# Patient Record
Sex: Male | Born: 1970 | Race: White | Hispanic: No | Smoking: Never smoker
Health system: Southern US, Community
[De-identification: ages and names within clinical notes are randomized; demographics above are authoritative.]

## PROBLEM LIST (undated history)

## (undated) DIAGNOSIS — C4490 Unspecified malignant neoplasm of skin, unspecified: Secondary | ICD-10-CM

## (undated) DIAGNOSIS — R002 Palpitations: Secondary | ICD-10-CM

## (undated) DIAGNOSIS — F419 Anxiety disorder, unspecified: Secondary | ICD-10-CM

## (undated) DIAGNOSIS — F329 Major depressive disorder, single episode, unspecified: Secondary | ICD-10-CM

## (undated) DIAGNOSIS — F32A Depression, unspecified: Secondary | ICD-10-CM

## (undated) DIAGNOSIS — J45909 Unspecified asthma, uncomplicated: Secondary | ICD-10-CM

## (undated) HISTORY — DX: Major depressive disorder, single episode, unspecified: F32.9

## (undated) HISTORY — DX: Depression, unspecified: F32.A

## (undated) HISTORY — DX: Unspecified asthma, uncomplicated: J45.909

## (undated) HISTORY — DX: Unspecified malignant neoplasm of skin, unspecified: C44.90

## (undated) HISTORY — DX: Anxiety disorder, unspecified: F41.9

## (undated) HISTORY — DX: Palpitations: R00.2

---

## 2009-03-02 ENCOUNTER — Ambulatory Visit: Payer: Self-pay | Admitting: Internal Medicine

## 2009-08-04 ENCOUNTER — Ambulatory Visit: Payer: Self-pay | Admitting: Internal Medicine

## 2009-11-22 ENCOUNTER — Ambulatory Visit: Payer: Self-pay | Admitting: Urology

## 2010-06-06 ENCOUNTER — Ambulatory Visit: Payer: Self-pay | Admitting: Urology

## 2012-08-05 ENCOUNTER — Ambulatory Visit (INDEPENDENT_AMBULATORY_CARE_PROVIDER_SITE_OTHER): Payer: BC Managed Care – PPO | Admitting: Cardiovascular Disease

## 2012-08-05 ENCOUNTER — Encounter: Payer: Self-pay | Admitting: Cardiovascular Disease

## 2012-08-05 VITALS — BP 120/70 | HR 72 | Ht 68.0 in | Wt 146.5 lb

## 2012-08-05 VITALS — BP 128/70 | HR 72 | Ht 68.0 in | Wt 146.5 lb

## 2012-08-05 DIAGNOSIS — M542 Cervicalgia: Secondary | ICD-10-CM

## 2012-08-05 DIAGNOSIS — R079 Chest pain, unspecified: Secondary | ICD-10-CM

## 2012-08-05 DIAGNOSIS — R002 Palpitations: Secondary | ICD-10-CM

## 2012-08-05 NOTE — Patient Instructions (Addendum)
Stress test was normal. Please discuss additional workup with your primary care physician if needed.

## 2012-08-05 NOTE — Patient Instructions (Addendum)
Patient to have a treadmill today. Follow up as needed.

## 2012-08-05 NOTE — Progress Notes (Signed)
   Patient ID: Jesse Norman, male    DOB: 1970/09/27, 41 y.o.   MRN: [id number]  HPI Comments: Mr. Jesse Norman is a very pleasant gentleman with history of palpitations, patient of Dr. Arlana Pouch with recent dog bite to the right part of his neck and symptoms of neck throbbing in chest discomfort since that time.   He reports that he was doing well until the dog bite. No skin lacerations or wounds noted and they are all healed at this time from the bite. He has reported a strong throbbing in his neck typically with exertion sometimes radiating into his upper chest. Walking seems to make this worse. Sometimes noted at rest as well. There seems to be a positional component. He denies any nose drainage ear drainage, no throat swelling. No cough or edema. He typically works out on a regular basis but has not done so recently.  EKG shows normal sinus rhythm with rate 72 beats per minute, no significant ST or T wave changes.    Outpatient Encounter Prescriptions as of 08/05/2012  Medication Sig Dispense Refill  . sertraline (ZOLOFT) 50 MG tablet Take 50 mg by mouth daily.       Review of Systems  Constitutional: Negative.   HENT: Positive for neck pain.   Eyes: Negative.   Respiratory: Negative.   Cardiovascular: Positive for chest pain and palpitations.  Gastrointestinal: Negative.   Skin: Negative.   Neurological: Negative.   Hematological: Negative.   Psychiatric/Behavioral: Negative.   All other systems reviewed and are negative.    BP 120/70  Pulse 72  Ht 5\' 8"  (1.727 m)  Wt 146 lb 8 oz (66.452 kg)  BMI 22.28 kg/m2  Physical Exam  Nursing note and vitals reviewed. Constitutional: He is oriented to person, place, and time. He appears well-developed and well-nourished.  HENT:  Head: Normocephalic.  Nose: Nose normal.  Mouth/Throat: Oropharynx is clear and moist.  Eyes: Conjunctivae normal are normal. Pupils are equal, round, and reactive to light.  Neck: Normal range of motion. Neck supple.  No JVD present.  Cardiovascular: Normal rate, regular rhythm, S1 normal, S2 normal, normal heart sounds and intact distal pulses.  Exam reveals no gallop and no friction rub.   No murmur heard. Pulmonary/Chest: Effort normal and breath sounds normal. No respiratory distress. He has no wheezes. He has no rales. He exhibits no tenderness.  Abdominal: Soft. Bowel sounds are normal. He exhibits no distension. There is no tenderness.  Musculoskeletal: Normal range of motion. He exhibits no edema and no tenderness.  Lymphadenopathy:    He has no cervical adenopathy.  Neurological: He is alert and oriented to person, place, and time. Coordination normal.  Skin: Skin is warm and dry. No rash noted. No erythema.  Psychiatric: He has a normal mood and affect. His behavior is normal. Judgment and thought content normal.           Assessment and Plan

## 2012-08-05 NOTE — Procedures (Signed)
Treadmill ordered for recent epsiodes of chest pain.  Resting EKG shows NSR with rate of 81 with no significant St or T wave changes Resting blood pressure of 122/72. Stand bruce protocal was used.  Patient exercised for 10 min 00 sec,  Peak heart rate of 163 bpm.  This was 92% of the maximum predicted heart rate (target heart rate 150). Achieved 12.9 METS No symptoms of chest pain or lightheadedness were reported at peak stress or in recovery.  Peak Blood pressure recorded was 170/82 Heart rate at 3 minutes in recovery was 91 bpm. No significant ST changes concerning for ischemia  FINAL IMPRESSION: Normal exercise stress test. No significant EKG changes concerning for ischemia. Excellent exercise tolerance.

## 2012-08-05 NOTE — Assessment & Plan Note (Signed)
Etiology of his neck and chest pain is uncertain. We have discussed the various treatment options including medical management versus treadmill testing. He would like a treadmill to exclude cardiac etiology. This will be scheduled today. If this is normal, no further cardiac workup would likely be indicated. Normal clinical exam.

## 2016-09-25 DIAGNOSIS — C4490 Unspecified malignant neoplasm of skin, unspecified: Secondary | ICD-10-CM

## 2016-09-25 HISTORY — PX: OTHER SURGICAL HISTORY: SHX169

## 2016-09-25 HISTORY — DX: Unspecified malignant neoplasm of skin, unspecified: C44.90

## 2017-12-14 ENCOUNTER — Other Ambulatory Visit: Payer: Self-pay

## 2017-12-14 ENCOUNTER — Ambulatory Visit
Admission: EM | Admit: 2017-12-14 | Discharge: 2017-12-14 | Disposition: A | Discharge status: Home / Self Care | Payer: BC Managed Care – PPO | Attending: Family Medicine | Admitting: Family Medicine

## 2017-12-14 ENCOUNTER — Encounter: Payer: Self-pay | Admitting: *Deleted

## 2017-12-14 ENCOUNTER — Ambulatory Visit (INDEPENDENT_AMBULATORY_CARE_PROVIDER_SITE_OTHER): Payer: BC Managed Care – PPO

## 2017-12-14 DIAGNOSIS — R1012 Left upper quadrant pain: Secondary | ICD-10-CM

## 2017-12-14 DIAGNOSIS — R1013 Epigastric pain: Secondary | ICD-10-CM | POA: Diagnosis not present

## 2017-12-14 LAB — URINALYSIS, COMPLETE (UACMP) WITH MICROSCOPIC
BILIRUBIN URINE: NEGATIVE
Bacteria, UA: NONE SEEN
Glucose, UA: NEGATIVE mg/dL
Ketones, ur: NEGATIVE mg/dL
Leukocytes, UA: NEGATIVE
NITRITE: NEGATIVE
Protein, ur: NEGATIVE mg/dL
SPECIFIC GRAVITY, URINE: 1.01 (ref 1.005–1.030)
Squamous Epithelial / LPF: NONE SEEN
WBC UA: NONE SEEN WBC/hpf (ref 0–5)
pH: 6 (ref 5.0–8.0)

## 2017-12-14 LAB — COMPREHENSIVE METABOLIC PANEL
ALT: 14 U/L — AB (ref 17–63)
AST: 24 U/L (ref 15–41)
Albumin: 4.2 g/dL (ref 3.5–5.0)
Alkaline Phosphatase: 47 U/L (ref 38–126)
Anion gap: 8 (ref 5–15)
BUN: 13 mg/dL (ref 6–20)
CO2: 27 mmol/L (ref 22–32)
CREATININE: 1.04 mg/dL (ref 0.61–1.24)
Calcium: 9.1 mg/dL (ref 8.9–10.3)
Chloride: 99 mmol/L — ABNORMAL LOW (ref 101–111)
GFR calc non Af Amer: >60 mL/min (ref >60)
Glucose, Bld: 128 mg/dL — ABNORMAL HIGH (ref 65–99)
Potassium: 3.9 mmol/L (ref 3.5–5.1)
SODIUM: 134 mmol/L — AB (ref 135–145)
Total Bilirubin: 0.8 mg/dL (ref 0.3–1.2)
Total Protein: 7.6 g/dL (ref 6.5–8.1)

## 2017-12-14 LAB — LIPASE, BLOOD: Lipase: 25 U/L (ref 11–51)

## 2017-12-14 NOTE — ED Provider Notes (Signed)
[facility]    CSN: [id number] Arrival date & time: 12/14/17  1513     History   Chief Complaint Chief Complaint  Patient presents with  . Abdominal Pain    HPI Jesse Norman is a 47 y.o. male.    Abdominal Pain  Pain location:  Epigastric and LUQ Pain quality: aching   Pain radiates to:  Does not radiate Pain severity:  Mild Onset quality:  Gradual Duration:  4 weeks (worse LUQ for 1 week) Timing:  Constant Progression:  Waxing and waning Chronicity:  New Context: not trauma   Relieved by:  Nothing Worsened by:  Nothing Associated symptoms: no chest pain, no chills, no constipation, no cough, no diarrhea, no dysuria, no fever, no hematochezia, no hematuria, no melena, no nausea, no shortness of breath and no sore throat     Past Medical History:  Diagnosis Date  . Anxiety and depression   . Asthma    allergies  . Palpitations     Patient Active Problem List   Diagnosis Date Noted  . Chest pain 08/05/2012    History reviewed. No pertinent surgical history.     Home Medications    Prior to Admission medications   Medication Sig Start Date End Date Taking? Authorizing Provider  sertraline (ZOLOFT) 50 MG tablet Take 50 mg by mouth.    [provider]    Family History Family History  Problem Relation Age of Onset  . Heart attack Maternal Grandfather     Social History Social History   Tobacco Use  . Smoking status: Never Smoker  . Smokeless tobacco: Never Used  Substance Use Topics  . Alcohol use: Yes    Comment: social drinker  . Drug use: No     Allergies   Patient has no known allergies.   Review of Systems Review of Systems  Constitutional: Negative for chills and fever.  HENT: Negative for drooling, ear discharge, ear pain and sore throat.   Respiratory: Negative for cough, shortness of breath and wheezing.   Cardiovascular: Negative for chest pain, palpitations and leg swelling.  Gastrointestinal:  Positive for abdominal pain. Negative for blood in stool, constipation, diarrhea, hematochezia, melena and nausea.  Endocrine: Negative for polydipsia.  Genitourinary: Negative for dysuria, frequency, hematuria and urgency.  Musculoskeletal: Negative for back pain, myalgias and neck pain.  Skin: Negative for rash.  Allergic/Immunologic: Negative for environmental allergies.  Neurological: Negative for dizziness and headaches.  Hematological: Does not bruise/bleed easily.  Psychiatric/Behavioral: Negative for suicidal ideas. The patient is not nervous/anxious.      Physical Exam Triage Vital Signs ED Triage Vitals  Enc Vitals Group     BP 12/14/17 1614 (!) 143/79     Pulse Rate 12/14/17 1614 85     Resp 12/14/17 1614 16     Temp 12/14/17 1614 98.7 F (37.1 C)     Temp Source 12/14/17 1614 Oral     SpO2 12/14/17 1614 100 %     Weight 12/14/17 1618 147 lb (66.7 kg)     Height 12/14/17 1618 5\' 8"  (1.727 m)     Head Circumference --      Peak Flow --      Pain Score 12/14/17 1615 2     Pain Loc --      Pain Edu? --      Excl. in [place]? --    No data found.  Updated Vital Signs BP (!) 143/79 (BP Location: Left Arm)  Pulse 85   Temp 98.7 F (37.1 C) (Oral)   Resp 16   Ht 5\' 8"  (1.727 m)   Wt 147 lb (66.7 kg)   SpO2 100%   BMI 22.35 kg/m   Visual Acuity Right Eye Distance:   Left Eye Distance:   Bilateral Distance:    Right Eye Near:   Left Eye Near:    Bilateral Near:     Physical Exam  Constitutional: He appears well-developed.  HENT:  Head: Normocephalic.  Mouth/Throat: Oropharynx is clear and moist.  Eyes: Pupils are equal, round, and reactive to light. EOM are normal.  Cardiovascular: Normal rate, regular rhythm and normal heart sounds. Exam reveals no gallop and no friction rub.  No murmur heard. Pulmonary/Chest: Effort normal and breath sounds normal. No respiratory distress. He has no wheezes.  Abdominal: Soft. Normal appearance, normal aorta and bowel  sounds are normal. He exhibits no distension, no ascites and no mass. There is hepatomegaly. There is no splenomegaly. There is tenderness in the epigastric area and left upper quadrant. There is no guarding and no CVA tenderness.  Skin: Skin is warm.     UC Treatments / Results  Labs (all labs ordered are listed, but only abnormal results are displayed) Labs Reviewed  URINALYSIS, COMPLETE (UACMP) WITH MICROSCOPIC - Abnormal; Notable for the following components:      Result Value   Hgb urine dipstick TRACE (*)    All other components within normal limits  COMPREHENSIVE METABOLIC PANEL - Abnormal; Notable for the following components:   Sodium 134 (*)    Chloride 99 (*)    Glucose, Bld 128 (*)    ALT 14 (*)    All other components within normal limits  LIPASE, BLOOD    EKG None Radiology Dg Abd 1 View  Result Date: 12/14/2017 CLINICAL DATA:  Flank pain on the left.  History of kidney stones. EXAM: ABDOMEN - 1 VIEW COMPARISON:  None. FINDINGS: The bowel gas pattern is normal. No radio-opaque calculi or other significant radiographic abnormality are seen. IMPRESSION: No radiopaque calculi overlying the renal shadows or ureters. Electronically Signed   By: Ulyses Jarred M.D.   On: 12/14/2017 17:22    Procedures Procedures (including critical care time)  Medications Ordered in UC Medications - No data to display   Initial Impression / Assessment and Plan / UC Course  I have reviewed the triage vital signs and the nursing notes.  Pertinent labs & imaging results that were available during my care of the patient were reviewed by me and considered in my medical decision making (see chart for details).       Final Clinical Impressions(s) / UC Diagnoses   Final diagnoses:  Left upper quadrant pain    ED Discharge Orders    None       Controlled Substance Prescriptions [place] Controlled Substance Registry consulted?    Juline Patch, MD 12/14/17 [phone]

## 2017-12-14 NOTE — ED Triage Notes (Addendum)
Patient started having periodic abdominal pain 1 month ago. Last PM the LUQ pain became worse radiating to the left flank. Previous history of left side kidney stones 8 years ago.

## 2018-01-21 ENCOUNTER — Other Ambulatory Visit: Payer: Self-pay

## 2018-01-21 ENCOUNTER — Ambulatory Visit
Admission: EM | Admit: 2018-01-21 | Discharge: 2018-01-21 | Discharge status: Against Medical Advice | Payer: BC Managed Care – PPO

## 2018-01-21 ENCOUNTER — Ambulatory Visit
Admission: EM | Admit: 2018-01-21 | Discharge: 2018-01-21 | Disposition: A | Discharge status: Home / Self Care | Payer: BC Managed Care – PPO | Attending: Emergency Medicine | Admitting: Emergency Medicine

## 2018-01-21 DIAGNOSIS — W268XXA Contact with other sharp object(s), not elsewhere classified, initial encounter: Secondary | ICD-10-CM | POA: Diagnosis not present

## 2018-01-21 DIAGNOSIS — Z23 Encounter for immunization: Secondary | ICD-10-CM

## 2018-01-21 DIAGNOSIS — S61231A Puncture wound without foreign body of left index finger without damage to nail, initial encounter: Secondary | ICD-10-CM

## 2018-01-21 DIAGNOSIS — S61238A Puncture wound without foreign body of other finger without damage to nail, initial encounter: Secondary | ICD-10-CM

## 2018-01-21 MED ORDER — TETANUS-DIPHTH-ACELL PERTUSSIS 5-2.5-18.5 LF-MCG/0.5 IM SUSP
0.5000 mL | Freq: Once | INTRAMUSCULAR | Status: AC
  Administered 2018-01-21: 0.5 mL via INTRAMUSCULAR

## 2018-01-21 NOTE — Discharge Instructions (Signed)
Your tetanus has been updated. Watch for signs of infection such as swelling, redness, draining pus, if any of the symptoms, return to clinic for reevaluation. Otherwise follow-up with primary care as needed.

## 2018-01-21 NOTE — ED Triage Notes (Signed)
Pt was tuning his guitar when one of the strings caused a puncture wound to left hand index finger. Reports he is here for tetanus shot.

## 2018-01-21 NOTE — ED Provider Notes (Signed)
[facility]    CSN: [id number] Arrival date & time: 01/21/18  1009     History   Chief Complaint Chief Complaint  Patient presents with  . Puncture Wound    HPI Jesse Norman is a 47 y.o. male.   47 year old male presents to clinic for updating tetanus vaccine. States he received a puncture wound while changing out his guitar strains 48 hours ago. Does not recall his last tetanus vaccine, however greater than 10 years ago. Denies any pain, redness, swelling, pain with movement, discharge from the finger. He is right handed. Injured finger is th left index finger.no systemic symptoms such as weakness, malaise, fever, nausea, etc.  The history is provided by the patient.    Past Medical History:  Diagnosis Date  . Anxiety and depression   . Asthma    allergies  . Palpitations     Patient Active Problem List   Diagnosis Date Noted  . Chest pain 08/05/2012    History reviewed. No pertinent surgical history.     Home Medications    Prior to Admission medications   Not on File    Family History Family History  Problem Relation Age of Onset  . Heart attack Maternal Grandfather     Social History Social History   Tobacco Use  . Smoking status: Never Smoker  . Smokeless tobacco: Never Used  Substance Use Topics  . Alcohol use: Yes    Comment: social drinker  . Drug use: No     Allergies   Patient has no known allergies.   Review of Systems Review of Systems  Constitutional: Negative for activity change, appetite change, chills, fatigue and fever.  Gastrointestinal: Negative for nausea and vomiting.  Musculoskeletal: Negative for arthralgias and joint swelling.  Skin: Positive for wound. Negative for color change.  Neurological: Negative for dizziness and light-headedness.     Physical Exam Triage Vital Signs ED Triage Vitals  Enc Vitals Group     BP 01/21/18 1016 138/89     Pulse Rate 01/21/18 1016 84     Resp 01/21/18 1016 16       Temp 01/21/18 1016 97.9 F (36.6 C)     Temp Source 01/21/18 1016 Oral     SpO2 01/21/18 1016 100 %     Weight 01/21/18 1018 148 lb (67.1 kg)     Height 01/21/18 1018 5\' 8"  (1.727 m)     Head Circumference --      Peak Flow --      Pain Score 01/21/18 1018 1     Pain Loc --      Pain Edu? --      Excl. in [place]? --    No data found.  Updated Vital Signs BP 138/89 (BP Location: Right Arm)   Pulse 84   Temp 97.9 F (36.6 C) (Oral)   Resp 16   Ht 5\' 8"  (1.727 m)   Wt 148 lb (67.1 kg)   SpO2 100%   BMI 22.50 kg/m   Visual Acuity Right Eye Distance:   Left Eye Distance:   Bilateral Distance:    Right Eye Near:   Left Eye Near:    Bilateral Near:     Physical Exam  Constitutional: He appears well-developed and well-nourished. No distress.  HENT:  Head: Normocephalic and atraumatic.  Musculoskeletal: He exhibits no tenderness.  Neurological: He is alert.  Skin: Skin is warm and dry. Capillary refill takes less than 2 seconds. He is  not diaphoretic.  Small puncture wound for surface left index finger, no erythema, swelling, purlent discharge, capillary refill less than 2 seconds, no pain with flexion, extension, or other movement, sensation intact distally.  Nursing note and vitals reviewed.    UC Treatments / Results  Labs (all labs ordered are listed, but only abnormal results are displayed) Labs Reviewed - No data to display  EKG None Radiology No results found.  Procedures Procedures (including critical care time)  Medications Ordered in UC Medications  Tdap (BOOSTRIX) injection 0.5 mL (0.5 mLs Intramuscular Given 01/21/18 1100)     Initial Impression / Assessment and Plan / UC Course  I have reviewed the triage vital signs and the nursing notes.  Pertinent labs & imaging results that were available during my care of the patient were reviewed by me and considered in my medical decision making (see chart for details).     No evidence of infection,  impairment of sensory function, or movement, provided follow-up counseling should symptoms worsen, an updated tetanus. Return to clinic if any signs of infection.  Final Clinical Impressions(s) / UC Diagnoses   Final diagnoses:  Puncture wound of index finger, initial encounter    ED Discharge Orders    None       Controlled Substance Prescriptions [place] Controlled Substance Registry consulted? Not Applicable   Barnet Glasgow, NP 01/21/18 1121

## 2018-06-03 ENCOUNTER — Ambulatory Visit
Admission: EM | Admit: 2018-06-03 | Discharge: 2018-06-03 | Disposition: A | Discharge status: Home / Self Care | Payer: BC Managed Care – PPO | Attending: Registered Nurse | Admitting: Registered Nurse

## 2018-06-03 ENCOUNTER — Encounter: Payer: Self-pay | Admitting: Emergency Medicine

## 2018-06-03 ENCOUNTER — Other Ambulatory Visit: Payer: Self-pay

## 2018-06-03 DIAGNOSIS — R05 Cough: Secondary | ICD-10-CM | POA: Diagnosis not present

## 2018-06-03 DIAGNOSIS — B9789 Other viral agents as the cause of diseases classified elsewhere: Secondary | ICD-10-CM

## 2018-06-03 DIAGNOSIS — J019 Acute sinusitis, unspecified: Secondary | ICD-10-CM | POA: Diagnosis not present

## 2018-06-03 DIAGNOSIS — J069 Acute upper respiratory infection, unspecified: Secondary | ICD-10-CM

## 2018-06-03 MED ORDER — FLUTICASONE PROPIONATE 50 MCG/ACT NA SUSP: 1.0000 | Freq: Two times a day (BID) | NASAL | 0 refills | Status: DC

## 2018-06-03 MED ORDER — SALINE SPRAY 0.65 % NA SOLN: 2.0000 | NASAL | 0 refills | Status: DC

## 2018-06-03 MED ORDER — AMOXICILLIN-POT CLAVULANATE 875-125 MG PO TABS: 1.0000 | ORAL_TABLET | Freq: Two times a day (BID) | ORAL | 0 refills | Status: DC

## 2018-06-03 MED ORDER — LORATADINE 10 MG PO TABS: 10.0000 mg | ORAL_TABLET | Freq: Every day | ORAL | 0 refills | Status: DC

## 2018-06-03 NOTE — ED Provider Notes (Signed)
[facility]    CSN: [id number] Arrival date & time: 06/03/18  1105     History   Chief Complaint Chief Complaint  Patient presents with  . Nasal Congestion    HPI Jesse Norman is a 47 y.o. male.   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  Stated seasonal allergies typically don't bother him.  Some sick contacts at work.  Has tried homeopathic without relief doesn't typically like to take medications every day.     Past Medical History:  Diagnosis Date  . Anxiety and depression   . Asthma    allergies  . Palpitations     Patient Active Problem List   Diagnosis Date Noted  . Chest pain 08/05/2012    History reviewed. No pertinent surgical history.     Home Medications    Prior to Admission medications   Medication Sig Start Date End Date Taking? Authorizing Provider  amoxicillin-clavulanate (AUGMENTIN) 875-125 MG tablet Take 1 tablet by mouth every 12 (twelve) hours. 06/03/18   Takelia Urieta, Aura Fey, NP  fluticasone (FLONASE) 50 MCG/ACT nasal spray Place 1 spray into both nostrils 2 (two) times daily. 06/03/18 07/03/18  Tahnee Cifuentes, Aura Fey, NP  loratadine (CLARITIN) 10 MG tablet Take 1 tablet (10 mg total) by mouth daily. 06/03/18   Mckensie Scotti, Aura Fey, NP  sodium chloride (OCEAN) 0.65 % SOLN nasal spray Place 2 sprays into both nostrils every 2 (two) hours while awake. 06/03/18 07/03/18  Brezlyn Manrique, Aura Fey, NP    Family History Family History  Problem Relation Age of Onset  . Healthy Mother   . Melanoma Father   . Hypertension Sister   . Healthy Brother   . Heart attack Maternal Grandfather     Social History Social History   Tobacco Use  . Smoking status: Never Smoker  . Smokeless tobacco: Never Used  Substance Use Topics  . Alcohol use: Yes    Comment: social drinker  . Drug use: No     Allergies   Patient has no known  allergies.   Review of Systems Review of Systems  Constitutional: Positive for activity change and fatigue. Negative for appetite change, chills, diaphoresis, fever and unexpected weight change.  HENT: Positive for congestion, postnasal drip, rhinorrhea and sore throat. Negative for dental problem, drooling, ear discharge, ear pain, facial swelling, hearing loss, mouth sores, nosebleeds, sinus pressure, sinus pain, sneezing, tinnitus, trouble swallowing and voice change.   Eyes: Negative for photophobia, pain, discharge, redness, itching and visual disturbance.  Respiratory: Positive for cough. Negative for choking, chest tightness, shortness of breath, wheezing and stridor.   Cardiovascular: Negative for chest pain, palpitations and leg swelling.  Gastrointestinal: Negative for abdominal distention, abdominal pain, blood in stool, constipation, diarrhea, nausea and vomiting.  Endocrine: Negative for cold intolerance and heat intolerance.  Genitourinary: Negative for dysuria.  Musculoskeletal: Negative for arthralgias, back pain, gait problem, joint swelling, myalgias, neck pain and neck stiffness.  Skin: Negative for color change, pallor, rash and wound.  Allergic/Immunologic: Positive for environmental allergies. Negative for food allergies and immunocompromised state.  Neurological: Negative for dizziness, tremors, seizures, syncope, facial asymmetry, speech difficulty, weakness, light-headedness, numbness and headaches.  Hematological: Negative for adenopathy. Does not bruise/bleed easily.  Psychiatric/Behavioral: Negative for agitation, behavioral problems, confusion and sleep disturbance.     Physical Exam Triage Vital Signs ED Triage Vitals  Enc Vitals Group     BP 06/03/18  1116 (!) 146/91     Pulse Rate 06/03/18 1116 77     Resp 06/03/18 1116 16     Temp 06/03/18 1116 97.9 F (36.6 C)     Temp Source 06/03/18 1116 Oral     SpO2 06/03/18 1116 100 %     Weight 06/03/18 1117 150  lb (68 kg)     Height 06/03/18 1117 5\' 8"  (1.727 m)     Head Circumference --      Peak Flow --      Pain Score 06/03/18 1117 0     Pain Loc --      Pain Edu? --      Excl. in [place]? --    No data found.  Updated Vital Signs BP (!) 146/91 (BP Location: Right Arm)   Pulse 77   Temp 97.9 F (36.6 C) (Oral)   Resp 16   Ht 5\' 8"  (1.727 m)   Wt 150 lb (68 kg)   SpO2 100%   BMI 22.81 kg/m   Visual Acuity Right Eye Distance:   Left Eye Distance:   Bilateral Distance:    Right Eye Near:   Left Eye Near:    Bilateral Near:     Physical Exam  Constitutional: He is oriented to person, place, and time. Vital signs are normal. He appears well-developed and well-nourished. He is active and cooperative.  Non-toxic appearance. He does not have a sickly appearance. He appears ill. No distress.  HENT:  Head: Normocephalic and atraumatic.  Right Ear: Hearing, external ear and ear canal normal. A middle ear effusion is present.  Left Ear: Hearing, external ear and ear canal normal. A middle ear effusion is present.  Nose: Mucosal edema and rhinorrhea present. No nose lacerations, sinus tenderness, nasal deformity, septal deviation or nasal septal hematoma. No epistaxis.  No foreign bodies. Right sinus exhibits no maxillary sinus tenderness and no frontal sinus tenderness. Left sinus exhibits maxillary sinus tenderness. Left sinus exhibits no frontal sinus tenderness.  Mouth/Throat: Uvula is midline and mucous membranes are normal. Mucous membranes are not pale, not dry and not cyanotic. He does not have dentures. No oral lesions. No trismus in the jaw. Normal dentition. No dental abscesses, uvula swelling, lacerations or dental caries. Posterior oropharyngeal edema and posterior oropharyngeal erythema present. No oropharyngeal exudate or tonsillar abscesses. No tonsillar exudate.  Left maxillary sinus TTP and pressure; cobblestoning posterior pharynx; bilateral TMs air fluid level clear; bilateral  allergic shiners; nasal turbinates edema/erythema clear discharge  Eyes: Pupils are equal, round, and reactive to light. Conjunctivae, EOM and lids are normal. Right eye exhibits no chemosis, no discharge, no exudate and no hordeolum. No foreign body present in the right eye. Left eye exhibits no chemosis, no discharge, no exudate and no hordeolum. No foreign body present in the left eye. Right conjunctiva is not injected. Right conjunctiva has no hemorrhage. Left conjunctiva is not injected. Left conjunctiva has no hemorrhage. No scleral icterus. Right eye exhibits normal extraocular motion and no nystagmus. Left eye exhibits normal extraocular motion and no nystagmus. Right pupil is round and reactive. Left pupil is round and reactive. Pupils are equal.  Neck: Trachea normal, normal range of motion and phonation normal. Neck supple. No tracheal tenderness, no spinous process tenderness and no muscular tenderness present. No neck rigidity. No tracheal deviation, no edema, no erythema and normal range of motion present. No thyroid mass and no thyromegaly present.  Cardiovascular: Normal rate, regular rhythm, S1 normal, S2 normal,  normal heart sounds and intact distal pulses. PMI is not displaced. Exam reveals no gallop and no friction rub.  No murmur heard. Pulmonary/Chest: Effort normal and breath sounds normal. No stridor. No respiratory distress. He has no decreased breath sounds. He has no wheezes. He has no rhonchi. He has no rales.  No cough observed in exam room; spoke full sentences without difficulty  Abdominal: Soft. Normal appearance. He exhibits no distension, no fluid wave and no ascites. There is no rigidity and no guarding.  Musculoskeletal: Normal range of motion. He exhibits no edema or tenderness.       Right shoulder: Normal.       Left shoulder: Normal.       Right elbow: Normal.      Left elbow: Normal.       Right hip: Normal.       Left hip: Normal.       Right knee: Normal.        Left knee: Normal.       Cervical back: Normal.       Thoracic back: Normal.       Lumbar back: Normal.       Right hand: Normal.       Left hand: Normal.  Lymphadenopathy:       Head (right side): No submental, no submandibular, no tonsillar, no preauricular, no posterior auricular and no occipital adenopathy present.       Head (left side): No submental, no submandibular, no tonsillar, no preauricular, no posterior auricular and no occipital adenopathy present.    He has no cervical adenopathy.       Right cervical: No superficial cervical, no deep cervical and no posterior cervical adenopathy present.      Left cervical: No superficial cervical, no deep cervical and no posterior cervical adenopathy present.  Neurological: He is alert and oriented to person, place, and time. He has normal strength. He is not disoriented. He displays no atrophy and no tremor. No cranial nerve deficit or sensory deficit. He exhibits normal muscle tone. He displays no seizure activity. Coordination and gait normal. GCS eye subscore is 4. GCS verbal subscore is 5. GCS motor subscore is 6.  On/off exam table and in/out of chair without difficulty; gait sure and steady in hallway  Skin: Skin is warm, dry and intact. Capillary refill takes less than 2 seconds. No abrasion, no bruising, no burn, no ecchymosis, no laceration, no lesion, no petechiae and no rash noted. He is not diaphoretic. No cyanosis or erythema. No pallor. Nails show no clubbing.  Psychiatric: He has a normal mood and affect. His speech is normal and behavior is normal. Judgment and thought content normal. He is not actively hallucinating. Cognition and memory are normal. He is attentive.  Nursing note and vitals reviewed.    UC Treatments / Results  Labs (all labs ordered are listed, but only abnormal results are displayed) Labs Reviewed - No data to display  EKG None  Radiology No results found.  Procedures Procedures (including  critical care time)  Medications Ordered in UC Medications - No data to display  Initial Impression / Assessment and Plan / UC Course  I have reviewed the triage vital signs and the nursing notes.  Pertinent labs & imaging results that were available during my care of the patient were reviewed by me and considered in my medical decision making (see chart for details).   viral URI with cough and acute rhinosinusitis  Discussed viral  uri in community typically 5-10 days runny nose/cough/fatigue most common some patients experiencing stomach upset nausea/vomiting/diarrhea.  Patient may use normal saline nasal spray 2 sprays each nostril q2h wa as needed. Start flonase 31mcg 1 spray each nostril BID #1 RF0.  Patient denied personal or family history of ENT cancer.  OTC antihistamine of choice claritin/zyrtec 10mg  po daily.  Avoid triggers if possible.  Shower prior to bedtime if exposed to triggers.  If allergic dust/dust mites recommend mattress/pillow covers/encasements; washing linens, vacuuming, sweeping, dusting weekly.  Call or return to clinic as needed if these symptoms worsen or fail to improve as anticipated.   Exitcare handout on allergic rhinitis, viral URI, sinusitis and sinus rinse given to patient.  Patient verbalized understanding of instructions, agreed with plan of care and had no further questions at this time.  P2:  Avoidance and hand washing.  Tylenol 1000mg  po QID prn pain.  Decrease dairy intake as increases mucous production.  Hydrate to keep urine pale clear and yellow tinged.  Rest.  Work excuse for the rest of todays shift given to patient.  start flonase 1 spray each nostril BID, saline 2 sprays each nostril q2h wa prn congestion.  If no improvement with 72 hours of saline and flonase use start augmentin 875mg  po BID x 10 days #20 RF0.  Electronic Rx given.  Denied personal or family history of ENT cancer.  Shower BID especially prior to bed. No evidence of systemic bacterial  infection, non toxic and well hydrated.  I do not see where any further testing or imaging is necessary at this time.   I will suggest supportive care, rest, good hygiene and encourage the patient to take adequate fluids.  The patient is to return to clinic or EMERGENCY ROOM if symptoms worsen or change significantly.  Exitcare handout on sinusitis and sinus rinse given to patient.  Patient verbalized agreement and understanding of treatment plan and had no further questions at this time.   P2:  Hand washing and cover cough Final Clinical Impressions(s) / UC Diagnoses   Final diagnoses:  Acute rhinosinusitis  Viral URI with cough     Discharge Instructions     Neti pot twice a day am/pm Flonase 1 spray each nostril am/pm after neti pot Take claritin 10mg  by mouth once a day If still teeth pain/sinus pressure after 3 days of nose sprays and claritin start augmentin 875mg  by mouth twice a day for 10 days    ED Prescriptions    Medication Sig Dispense Auth. Provider   loratadine (CLARITIN) 10 MG tablet Take 1 tablet (10 mg total) by mouth daily. 30 tablet Eniya Cannady A, NP   sodium chloride (OCEAN) 0.65 % SOLN nasal spray Place 2 sprays into both nostrils every 2 (two) hours while awake.  Jovane Foutz A, NP   fluticasone (FLONASE) 50 MCG/ACT nasal spray Place 1 spray into both nostrils 2 (two) times daily. 16 g Dub Maclellan A, NP   amoxicillin-clavulanate (AUGMENTIN) 875-125 MG tablet Take 1 tablet by mouth every 12 (twelve) hours. 20 tablet Adelene Polivka, Aura Fey, NP     Controlled Substance Prescriptions [place] Controlled Substance Registry consulted? Not Applicable   Olen Cordial, NP 06/03/18 2326

## 2018-06-03 NOTE — Discharge Instructions (Addendum)
Neti pot twice a day am/pm Flonase 1 spray each nostril am/pm after neti pot Take claritin 10mg  by mouth once a day If still teeth pain/sinus pressure after 3 days of nose sprays and claritin start augmentin 875mg  by mouth twice a day for 10 days

## 2018-06-03 NOTE — ED Triage Notes (Signed)
Patient in today c/o nasal congestion, sinus pressure/pain x 1 week.

## 2018-07-15 ENCOUNTER — Encounter: Payer: Self-pay | Admitting: Emergency Medicine

## 2018-07-15 ENCOUNTER — Other Ambulatory Visit: Payer: Self-pay

## 2018-07-15 ENCOUNTER — Ambulatory Visit
Admission: EM | Admit: 2018-07-15 | Discharge: 2018-07-15 | Disposition: A | Discharge status: Home / Self Care | Payer: BC Managed Care – PPO | Attending: Internal Medicine | Admitting: Internal Medicine

## 2018-07-15 DIAGNOSIS — R079 Chest pain, unspecified: Secondary | ICD-10-CM | POA: Diagnosis present

## 2018-07-15 DIAGNOSIS — M940 Chondrocostal junction syndrome [Tietze]: Secondary | ICD-10-CM | POA: Insufficient documentation

## 2018-07-15 NOTE — ED Triage Notes (Signed)
Patient c/o chest pain, 1st episode was on Friday. He had another episode today. Denies shortness of breath, diaphoresis. Patient states he did have some jaw pain yesterday and today.

## 2018-07-15 NOTE — Discharge Instructions (Signed)
Take Ibuprofen 400 -800 mg every 8 hours for 5-7 days  Follow up with your family Dr this week. If you get worse, or have change of symptoms like chest pressure, sweating, nausea, of chest pressure when you have palpitations call an ambulance to come get you to take you to the Er.

## 2018-07-15 NOTE — ED Provider Notes (Addendum)
[facility]    CSN: [id number] Arrival date & time: 07/15/18  1212     History   Chief Complaint Chief Complaint  Patient presents with  . Chest Pain    HPI Jesse Norman is a 47 y.o. male.   Onset of chest pains on R mid chest 3 days ago and would last a few seconds and described as a little stab, and would have a couple in 10 min, and would go away for a few hours. Then none  Til today, and had same pain on L chest this time, and has not returned yet. In the past  2 weeks has felt bilateral neck discomfort " sore' and palpation does not make it worse with some intermittent L jaw similar symptoms which would last a few seconds and after 10 seconds will feel it, and would feel some cramping on back as well. Neg CAD in family under 66 y. His last physical > 1 y. In the past his cholesterol levels were normal 4 years ago. Admits increased stress and been off Zoloft x 6-7 months.      Past Medical History:  Diagnosis Date  . Anxiety and depression   . Asthma    allergies  . Palpitations     Patient Active Problem List   Diagnosis Date Noted  . Chest pain 08/05/2012    History reviewed. No pertinent surgical history.     Home Medications    Prior to Admission medications   Not on File    Family History Family History  Problem Relation Age of Onset  . Healthy Mother   . Melanoma Father   . Hypertension Sister   . Healthy Brother   . Heart attack Maternal Grandfather     Social History Social History   Tobacco Use  . Smoking status: Never Smoker  . Smokeless tobacco: Never Used  Substance Use Topics  . Alcohol use: Yes    Comment: social drinker  . Drug use: No     Allergies   Patient has no known allergies.   Review of Systems Review of Systems  Constitutional: Negative for chills and fatigue.       Has been working on loosing wt and is a runner.   HENT: Negative for tinnitus.   Respiratory: Negative for chest tightness and  shortness of breath.   Cardiovascular: Positive for chest pain and palpitations. Negative for leg swelling.       Has been having palpitations for 20 yrs and had nuclear stress 2014 and holter which were normal. The holter picked up those palpitations but was told it was nothing.   Gastrointestinal: Negative for abdominal pain and diarrhea.       Has been burping more. Sometimes gets intermittent abd pain off and on  Genitourinary: Positive for flank pain.       Had R flank pain last week which lasted for few hours, but did not noticed any urine color changes.   Skin: Negative for rash.  Neurological: Positive for light-headedness. Negative for dizziness, speech difficulty, weakness and headaches.       Occasion light headness when he stands up, not during chest pain     Physical Exam Triage Vital Signs ED Triage Vitals  Enc Vitals Group     BP 07/15/18 1228 (!) 150/94     Pulse Rate 07/15/18 1228 70     Resp 07/15/18 1228 18     Temp 07/15/18 1228 97.9 F (36.6 C)  Temp Source 07/15/18 1228 Oral     SpO2 07/15/18 1228 100 %     Weight 07/15/18 1227 145 lb (65.8 kg)     Height 07/15/18 1227 5\' 8"  (1.727 m)     Head Circumference --      Peak Flow --      Pain Score 07/15/18 1227 0     Pain Loc --      Pain Edu? --      Excl. in [place]? --    No data found.  Updated Vital Signs BP (!) 150/94 (BP Location: Right Arm)   Pulse 70   Temp 97.9 F (36.6 C) (Oral)   Resp 18   Ht 5\' 8"  (1.727 m)   Wt 145 lb (65.8 kg)   SpO2 100%   BMI 22.05 kg/m   Visual Acuity Right Eye Distance:   Left Eye Distance:   Bilateral Distance:    Right Eye Near:   Left Eye Near:    Bilateral Near:     Physical Exam  Constitutional: He is oriented to person, place, and time.  HENT:  Head: Normocephalic and atraumatic.  Eyes: Pupils are equal, round, and reactive to light.  Neck: Neck supple. No thyromegaly present.  Cardiovascular: Normal rate and regular rhythm.  No murmur heard.   No systolic murmur is present. Pulmonary/Chest: Effort normal and breath sounds normal.  Has chest wall tenderness on mid ribs similar to the pain he has been having but not as intense  Abdominal: Bowel sounds are normal. He exhibits no mass. There is no splenomegaly or hepatomegaly. There is no tenderness.  Lymphadenopathy:    He has no cervical adenopathy.  Neurological: He is alert and oriented to person, place, and time.  Skin: Capillary refill takes less than 2 seconds.  Psychiatric:  Has poor eye contact     UC Treatments / Results  Labs (all labs ordered are listed, but only abnormal results are displayed) Labs Reviewed - No data to display  EKG Normal  Radiology No results found.  Procedures   Medications Ordered in UC Medications - No data to display  Initial Impression / Assessment and Plan / UC Course  I have reviewed the triage vital signs and the nursing notes.  Pertinent labs & imaging results that were available during my care of the patient were reviewed by me and considered in my medical decision making (see chart for details).     Final Clinical Impressions(s) / UC Diagnoses   Final diagnoses:  Costochondritis     Discharge Instructions     Take Ibuprofen 400 -800 mg every 8 hours for 5-7 days  Follow up with your family Dr this week. If you get worse, or have change of symptoms like chest pressure, sweating, nausea, of chest pressure when you have palpitations call an ambulance to come get you to take you to the Er.     ED Prescriptions    None     Controlled Substance Prescriptions [place] Controlled Substance Registry consulted?    Shelby Mattocks, PA-C 07/18/18 1601    Rodriguez-[place], Sunday Spillers, PA-C 07/18/18 1606

## 2018-09-19 ENCOUNTER — Encounter: Payer: Self-pay | Admitting: Urology

## 2018-09-19 ENCOUNTER — Ambulatory Visit: Payer: BC Managed Care – PPO | Admitting: Urology

## 2018-09-19 VITALS — BP 147/85 | HR 89 | Ht 68.0 in | Wt 136.4 lb

## 2018-09-19 DIAGNOSIS — R309 Painful micturition, unspecified: Secondary | ICD-10-CM

## 2018-09-19 DIAGNOSIS — R102 Pelvic and perineal pain: Secondary | ICD-10-CM

## 2018-09-19 LAB — MICROSCOPIC EXAMINATION
BACTERIA UA: NONE SEEN
EPITHELIAL CELLS (NON RENAL): NONE SEEN /HPF (ref 0–10)
WBC, UA: NONE SEEN /hpf (ref 0–5)

## 2018-09-19 LAB — URINALYSIS, COMPLETE
Bilirubin, UA: NEGATIVE
Glucose, UA: NEGATIVE
KETONES UA: NEGATIVE
Leukocytes, UA: NEGATIVE
NITRITE UA: NEGATIVE
PH UA: 7 (ref 5.0–7.5)
Protein, UA: NEGATIVE
SPEC GRAV UA: 1.015 (ref 1.005–1.030)
Urobilinogen, Ur: 0.2 mg/dL (ref 0.2–1.0)

## 2018-09-19 MED ORDER — DIAZEPAM 5 MG PO TABS: 5.0000 mg | ORAL_TABLET | Freq: Once | ORAL | 0 refills | Status: DC | PRN

## 2018-09-19 NOTE — Progress Notes (Signed)
09/19/2018 6:04 PM   Jesse Norman 1971/03/23 [id number]  Referring provider: Albina Billet, MD [address]   [place], [place] [address]  CC: Dysuria  HPI: I saw Jesse Norman in urology clinic in consultation from Dr. Hall Busing for dysuria.  He is a 47 year old male with severe anxiety who reports 5 to 6 weeks of varied urinary symptoms.  He reports a "pulsing" sensation in his bladder, dysuria at the end of his urinary stream, and significant urinary frequency 10-12 times per day.  He has mild nocturia 1-2 times per night.  He also reports vague pelvic pain and discomfort, especially in the perineal suprapubic area.  He has no prior notable urologic history.  He denies any flank pain or hematuria.  Urinalysis and culture have all been negative, as well as STD testing.  He was treated by his primary with a extended course of Cipro, with no improvement in his symptoms.  He was also seen by Dr. Yves Dill.  There are no aggravating or alleviating factors.  The symptoms are very bothersome to him and worsening his anxiety.  Severity is moderate.  He denies a history of kidney stones.  He had a KUB in March 2019 that showed no radiopaque calculi.   PMH: Past Medical History:  Diagnosis Date  . Anxiety and depression   . Asthma    allergies  . Palpitations     Surgical History: No past surgical history on file.   Allergies: No Known Allergies  Family History: Family History  Problem Relation Age of Onset  . Healthy Mother   . Melanoma Father   . Hypertension Sister   . Healthy Brother   . Heart attack Maternal Grandfather     Social History:  reports that he has never smoked. He has never used smokeless tobacco. He reports current alcohol use. He reports that he does not use drugs.  ROS: Please see flowsheet from today's date for complete review of systems.  Physical Exam: BP (!) 147/85 (BP Location: Left Arm, Patient Position: Sitting, Cuff Size: Normal)   Pulse 89   Ht 5\' 8"   (1.727 m)   Wt 136 lb 6.4 oz (61.9 kg)   BMI 20.74 kg/m    Constitutional:  Alert and oriented, No acute distress. Cardiovascular: No clubbing, cyanosis, or edema. Respiratory: Normal respiratory effort, no increased work of breathing. GI: Abdomen is soft, nontender, nondistended, no abdominal masses GU: No CVA tenderness, phallus without lesions, widely patent meatus DRE: 40 g, prominent sulcus, no nodules or masses Lymph: No cervical or inguinal lymphadenopathy. Skin: No rashes, bruises or suspicious lesions. Neurologic: Grossly intact, no focal deficits, moving all 4 extremities. Psychiatric: Anxious  Laboratory Data: Reviewed, urinalyses all negative, STD testing negative  Urinalysis today 0 WBCs, 0-2 RBCs, no epithelial cells, no bacteria, nitrite negative  Assessment & Plan:   In summary, Jesse Norman is a healthy 47 year old male with severe anxiety with 6 weeks of of vague pelvic pain and dysuria with urinary frequency.  Infectious work-up has all been negative.  He does not have hematuria or flank pain concerning for nephrolithiasis.  His symptoms could be consistent with urethral stricture disease versus pelvic pain related to his anxiety.  -Cystoscopy next available to rule out urethral stricture/pathology.  If negative, recommend 4 weeks scheduled anti-inflammatories and referral to pelvic floor physical therapy.  Other options would include gabapentin or amitriptyline. -Discontinue antibiotics -Stop flomax as likely causing retrograde ejaculation  Billey Co, MD  [facility] [address] [place], [place] [address] [phone]

## 2018-09-25 HISTORY — PX: CYSTOSCOPY: SUR368

## 2018-09-26 ENCOUNTER — Encounter: Payer: Self-pay | Admitting: Urology

## 2018-09-26 ENCOUNTER — Ambulatory Visit (INDEPENDENT_AMBULATORY_CARE_PROVIDER_SITE_OTHER): Payer: BC Managed Care – PPO | Admitting: Urology

## 2018-09-26 ENCOUNTER — Telehealth: Payer: Self-pay | Admitting: Urology

## 2018-09-26 ENCOUNTER — Other Ambulatory Visit: Payer: Self-pay

## 2018-09-26 VITALS — BP 147/84 | HR 85 | Ht 68.0 in | Wt 136.0 lb

## 2018-09-26 DIAGNOSIS — R309 Painful micturition, unspecified: Secondary | ICD-10-CM | POA: Diagnosis not present

## 2018-09-26 LAB — URINALYSIS, COMPLETE
Bilirubin, UA: NEGATIVE
Glucose, UA: NEGATIVE
KETONES UA: NEGATIVE
Leukocytes, UA: NEGATIVE
NITRITE UA: NEGATIVE
Protein, UA: NEGATIVE
SPEC GRAV UA: 1.02 (ref 1.005–1.030)
UUROB: 0.2 mg/dL (ref 0.2–1.0)
pH, UA: 6 (ref 5.0–7.5)

## 2018-09-26 LAB — MICROSCOPIC EXAMINATION
EPITHELIAL CELLS (NON RENAL): NONE SEEN /HPF (ref 0–10)
WBC UA: NONE SEEN /HPF (ref 0–5)

## 2018-09-26 NOTE — Patient Instructions (Signed)
Take ibuprofen or Aleve scheduled for 3 weeks as instructed on the bottle to reduce any inflammation in the pelvis.   The pelvic floor physical therapist will call to schedule an appointment.  Follow up in 8 weeks for symptom check.

## 2018-09-26 NOTE — Telephone Encounter (Signed)
Pt was just seen earlier this morning, pt now calling the office asking to speak to Dr. Versie Starks, states he has questions about his prostate. Please advise pt at [phone].

## 2018-09-26 NOTE — Progress Notes (Signed)
Cystoscopy Procedure Note:  Indication: Dysuria, r/o stricture  After informed consent and discussion of the procedure and its risks, Jesse Norman was positioned and prepped in the standard fashion. Cystoscopy was performed with a flexible cystoscope. The urethra, bladder neck and entire bladder was visualized in a standard fashion. The prostate was small to moderate in size. The ureteral orifices were visualized in their normal location and orientation. Bladder grossly normal, procedure terminated secondary to patient discomfort.   Findings: Normal cystoscopy  Assessment and Plan: Scheduled NSAIDs 3 weeks Referral to pelvic floor PT Follow up with me in 8 weeks, consider gabapentin or amitriptyline if persistent symptoms  Jesse Madrid, MD 09/26/2018

## 2018-09-27 NOTE — Telephone Encounter (Signed)
Spoke to patient and answered his questions. He will call if he has any other problems.

## 2018-09-27 NOTE — Telephone Encounter (Signed)
Can you find out what questions he has? He had a normal cystoscopy yesterday with a normal sized prostate and no abnormalities. We discussed all of this at length after the procedure. There is no anatomic cause for his symptoms of dysuria and pelvic pain.  Thanks [name], MD 09/27/2018

## 2018-09-30 ENCOUNTER — Telehealth: Payer: Self-pay | Admitting: Urology

## 2018-09-30 ENCOUNTER — Ambulatory Visit (INDEPENDENT_AMBULATORY_CARE_PROVIDER_SITE_OTHER): Payer: BC Managed Care – PPO | Admitting: Family Medicine

## 2018-09-30 ENCOUNTER — Other Ambulatory Visit: Payer: Self-pay | Admitting: Family Medicine

## 2018-09-30 ENCOUNTER — Encounter: Payer: Self-pay | Admitting: Family Medicine

## 2018-09-30 VITALS — BP 109/73 | HR 91 | Ht 68.0 in | Wt 128.4 lb

## 2018-09-30 DIAGNOSIS — R309 Painful micturition, unspecified: Secondary | ICD-10-CM | POA: Diagnosis not present

## 2018-09-30 LAB — URINALYSIS, COMPLETE
Bilirubin, UA: NEGATIVE
GLUCOSE, UA: NEGATIVE
Ketones, UA: NEGATIVE
LEUKOCYTES UA: NEGATIVE
Nitrite, UA: NEGATIVE
PROTEIN UA: NEGATIVE
SPEC GRAV UA: 1.01 (ref 1.005–1.030)
Urobilinogen, Ur: 0.2 mg/dL (ref 0.2–1.0)
pH, UA: 6 (ref 5.0–7.5)

## 2018-09-30 LAB — MICROSCOPIC EXAMINATION
Bacteria, UA: NONE SEEN
Epithelial Cells (non renal): NONE SEEN /hpf (ref 0–10)
WBC, UA: NONE SEEN /hpf (ref 0–5)

## 2018-09-30 LAB — BLADDER SCAN AMB NON-IMAGING: Scan Result: 0

## 2018-09-30 NOTE — Progress Notes (Signed)
Patient presents today with dysuria. Patient had a Cysto last week and has had some discomfort, he states he feels like he is not emptying bladder. A urine was collected for UA, UCX. A PVR was also performed with a residual of 0. Patient's urine looks ok in office today we will wait for Urine Culture to come back before prescribing ABX.

## 2018-09-30 NOTE — Telephone Encounter (Signed)
I spoke to patient and will come in office today for UA.

## 2018-09-30 NOTE — Telephone Encounter (Signed)
Patient called stating that he had a cysto last week and that he is now having pain in his lower abdomen and said he had to call out of work today and he knows it is from the cysto. He would like a call back from the clinical staff to discuss He can be reached at [place]

## 2018-10-02 LAB — CULTURE, URINE COMPREHENSIVE

## 2018-10-10 ENCOUNTER — Telehealth: Payer: Self-pay | Admitting: Urology

## 2018-10-10 NOTE — Telephone Encounter (Signed)
Pt called office asking did Dr. Diamantina Providence get a good look at his bladder when he did the Cysto. Pt seems to be concerned about the findings and wants reassurance. Also pt asks does he need to continue ibuprofen until his follow up appt 2/27. Please advise pt at [phone].

## 2018-10-10 NOTE — Telephone Encounter (Signed)
Returned call to patient and advised Dr Doristine Counter note. Patient verbalized understanding.

## 2018-10-10 NOTE — Telephone Encounter (Signed)
Patient with severe anxiety and chronic pelvic pain. He had a completely normal cystoscopy recently, and his urethra, prostate, and bladder are all normal. He should take 3 weeks total of the scheduled ibuprofen, then stop.  [name], MD 10/10/2018

## 2018-10-11 ENCOUNTER — Encounter: Payer: Self-pay | Admitting: Physical Therapy

## 2018-10-11 ENCOUNTER — Other Ambulatory Visit: Payer: Self-pay

## 2018-10-11 ENCOUNTER — Ambulatory Visit: Payer: BC Managed Care – PPO | Attending: Urology | Admitting: Physical Therapy

## 2018-10-11 DIAGNOSIS — M6208 Separation of muscle (nontraumatic), other site: Secondary | ICD-10-CM | POA: Diagnosis present

## 2018-10-11 DIAGNOSIS — M62838 Other muscle spasm: Secondary | ICD-10-CM

## 2018-10-11 DIAGNOSIS — R293 Abnormal posture: Secondary | ICD-10-CM

## 2018-10-11 DIAGNOSIS — R278 Other lack of coordination: Secondary | ICD-10-CM

## 2018-10-11 NOTE — Patient Instructions (Signed)
  Avoid straining pelvic floor, abdominal muscles , spine  Use log rolling technique instead of getting out of bed with your neck or the sit-up     Log rolling into and out of bed If getting out of bed on L side, 1) Bent knees, scoot hips/ shoulder to R   Raise L arm completely overhead, rolling onto armpit  Then lower bent knees to bed to get into complete side lying position  Then drop legs off bed, and push up onto L elbow/forearm, and use R hand to push onto the bend   ______   Proper sitting posture, no more slouching Think points of contact at feet on the ground and sitting bones   _____  Deep core level 1 ( handout)   When you feel urge to urinate a second time after going, practice 3 slowed paced breaths

## 2018-10-11 NOTE — Therapy (Addendum)
[place] MAIN [facility] SERVICES [address] [place], [place], [address] Phone: [phone]   Fax:  [phone]  Physical Therapy Evaluation  Patient Details  Name: Jesse Norman MRN: [id number] Date of Birth: September 04, 1971 Referring Provider (PT): Dr. Diamantina Providence    Encounter Date: 10/11/2018  PT End of Session - 10/11/18 1728    Visit Number  1    Number of Visits  10    Date for PT Re-Evaluation  12/20/18    PT Start Time  0805    PT Stop Time  0915    PT Time Calculation (min)  70 min    Activity Tolerance  Patient tolerated treatment well    Behavior During Therapy  [facility] for tasks assessed/performed       Past Medical History:  Diagnosis Date  . Anxiety and depression   . Asthma    allergies  . Cancer of skin 2018   removal of basal cell at R neck   . Palpitations     Past Surgical History:  Procedure Laterality Date  . CYSTOSCOPY  2020  . skin cancer removal  2018   basal cell    There were no vitals filed for this visit.   Subjective Assessment - 10/11/18 0819    Subjective Pelvic pain and frequent urination: Pt reports he has seen three doctors. For one month an half in Oct-Nov in 2019, pt started experiencing sudden rectal pain and tailbone pain, described as a "dull, nuisance".  This pain stayed around all day with all positions. This pain worsened with harder stools. Denied fall onto tailbone. Pt sought his family doctor who performed a prostate exam. This pain persisted and he went to the walk-in clinic where he was told he had prostatitis. By mid-Nov, pt noticed an inflamed sensation inside his urethra which occur after urination sometimes.  Then it become a persistent feeling even when not urinating. Then pt went to a urologist a week later who also told him he had prostatitis and continue to take Ciprol. Then pt experienced frequent urination by the end of Dec.    Impact of frequent urination and pelvic pain on ability to  work: Frequent urination almost every hour and can feel his bladder more in an irritated way. Then he gets a dull pain in the penis, above the base of the penis , like a radiating pain between testicles and rectum area. "That freaks me out because I dont know what is going."  Pt has had to miss work last week for the first week. 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 Pt reports burning pelvic pain after peeing in the morning and have been laying on his back all night. Nocturia: atleast once. Pt tapers drinking after dinner. Daily fluid intake: 3 (16 fl oz) of water, no coffee, teas, soda, alcohol.  Impact of pelvic pain on his level of stress, eating habits and weight loss:  Pt has been restricting everything he eats because he is afraid he has IC which he learned about through the internet. Across 1 month, Pt has lost  15 lbs due to his food restrictions ( avoiding gluten, spicys, tomoatoes).  This restriction has caused more stress for him. Pt is also not working out nor running which he used to prior to getting the cystoscopy.  Impact of pelvic pain on this ability to exercise:   Since getting cystoscopy procedure which was painful for  him, pt has had not ran for 2.5 weeks. Pt's prior running routine consisted of 2 miles every 4-5 days at 8 min/ mile pain. Pt used to do weight lifting for 20 years but he has not performed since he had shoulder issues. Pt stopped doing push ups after he noticed the weight training were giving him issues.  Pt used to do sit-ups and crunches for 10 years.             [facility] PT Assessment - 10/11/18 1730      Assessment   Medical Diagnosis  pelvic pain , painful urination     Referring Provider (PT)  Dr. Diamantina Providence       Precautions   Precautions  None      Restrictions   Weight Bearing Restrictions  No      Balance Screen   Has the patient fallen in the past 6 months  No      Prior Function   Level of Independence   Independent      Observation/Other Assessments   Observations  slumped sitting, ankles cross. Able to demo proper posture with cues       Single Leg Stance   Comments  spinal pertubations with SLS B       Posture/Postural Control   Posture Comments  dyscoordination of pelvic floor, chest breathing . noted proper diaphragmatic excursion      Strength   Overall Strength Comments  hip ext B 3+/5, hip abd 4+/5 B       Bed Mobility   Bed Mobility  --   crunching method.               Objective measurements completed on examination: See above findings.    Pelvic Floor Special Questions - 10/11/18 1730    Diastasis Recti  3 fingers width above umbilicus     External Perineal Exam  through clothing, R anterior pelvic floor with tightness/ tenderness > L        [facility] Adult PT Treatment/Exercise - 10/11/18 1730      Neuro Re-ed    Neuro Re-ed Details   proper sitting posture, cued for CKC, alignment, withhold from crunches, proper body mechanics                    PT Long Term Goals - 10/11/18 1737      PT LONG TERM GOAL #1   Title  Pt will decrease his NIH-CPSI score from % to < % in order to eliminate urine without pelvic pain    Time  10    Period  Weeks    Status  New    Target Date  12/20/18      PT LONG TERM GOAL #2   Title  Pt will demo no tenderness/ tensions on R pelvic floor across 2 visits in order to progress to proper pelvic floor coordination     Time  8    Period  Weeks    Status  New    Target Date  12/06/18      PT LONG TERM GOAL #3   Title  Pt will demo less abdominal sepration from 41fingers width to < 2 fingers width in order to increase intraabdominal pressure to minimzie overactivity of pelvic floor mm     Time  4    Period  Weeks    Status  New    Target Date  11/08/18      PT LONG  TERM GOAL #4   Title  Pt will demo proper weight lifting and fitness routine with proper technique and no straining of abdominopelvic area in order  to minimzie relapse of Sx    Time  8    Period  Weeks    Status  New    Target Date  12/06/18      PT LONG TERM GOAL #5   Title  Pt will demo proper sitting psture without cues across 2 visits in order to sit at work optimial  and minimize abdominal pelvic tightness    Time  6    Period  Weeks    Status  New    Target Date  10/25/18      Additional Long Term Goals   Additional Long Term Goals  Yes      PT LONG TERM GOAL #6   Title  Pt will report less frequent urinary trips from once every half hour to once every 2 hours in order to perform work     Time  6    Period  Weeks    Status  New    Target Date  11/22/18      PT LONG TERM GOAL #7   Title  Pt will demo proper pelvic floor and deep core coordination and co-activation in all positions plus with running in order to return to running    Time  10    Period  Weeks    Status  New    Target Date  12/20/18      PT LONG TERM GOAL #8   Title  Pt will be IND with stretching and flexibility and well rounded workout routine to minimize risk for injuries, nor relapse of Sx    Time  6    Period  Weeks    Status  New    Target Date  11/22/18             Plan - 10/11/18 1728    Clinical Impression Statement Pt is a 48 yo male who complains of frequent urination and pelvic pain. These deficits impact his ability to work, exercise, and has negatively added stress to his life and eating habits. He presents with limited spinal mobility, diastasis recti, poor posture, dyscoordination and strength of pelvic floor mm, weak hip weakness, poor body mechanics which places strain on the abdominal/pelvic floor mm. These are deficits that indicate an ineffective intraabdominal pressure system associated with his Sx. Pt will benefit from proper coordination training and education on fitness and functional positions in order to yield greater outcomes.  Following Tx today,  advised pt and demonstrated proper body mechanics and  to not perform  sit-ups and crunches as these movement patterns lead to more downward forces on  pelvic floor.   Pt was provided education on anatomy and physiology related to dysfunction and impairments with use of visual anatomy pictures. Pt voiced he felt hopeful after receiving education and active listening, motivational interviewing.   Pt was provided provided education on the role of nn system and a proper intraabdominal pressure system on digestion, breathing, elimination of urine and bowels, and sexual function. Also educated the impact of improving mobility and learning to properly coordinate of pelvic floor, deep abdominal mm, and diaphragm to yield a better intraabdominal pressure system .      Clinical Presentation  Evolving    Clinical Decision Making  Moderate    Rehab Potential  Good    PT Frequency  1x / week    PT Duration  Other (comment)   10   PT Treatment/Interventions  Therapeutic activities;Functional mobility training;Neuromuscular re-education;Therapeutic exercise;Patient/family education;Manual techniques;Scar mobilization;Moist Heat;Gait training;Energy conservation    Consulted and Agree with Plan of Care  Patient       Patient will benefit from skilled therapeutic intervention in order to improve the following deficits and impairments:  Increased muscle spasms, Decreased range of motion, Decreased strength, Increased fascial restricitons, Postural dysfunction, Pain, Decreased safety awareness, Decreased coordination, Decreased balance, Decreased endurance, Difficulty walking  Visit Diagnosis: Abnormal posture  Other lack of coordination  Diastasis recti  Other muscle spasm     Problem List Patient Active Problem List   Diagnosis Date Noted  . Chest pain 08/05/2012    Jerl Mina ,PT, DPT, E-RYT  10/11/2018, 5:46 PM  [place] MAIN [facility] SERVICES [address] [place], [place], [address] Phone: [phone]   Fax:   [phone]  Name: Jesse Norman MRN: [id number] Date of Birth: 1970/10/21

## 2018-10-13 NOTE — Addendum Note (Signed)
Addended by: Jerl Mina on: 10/13/2018 09:37 PM   Modules accepted: Orders

## 2018-10-17 ENCOUNTER — Encounter: Payer: BC Managed Care – PPO | Admitting: Physical Therapy

## 2018-10-17 ENCOUNTER — Ambulatory Visit: Payer: BC Managed Care – PPO | Admitting: Physical Therapy

## 2018-10-18 ENCOUNTER — Encounter: Payer: BC Managed Care – PPO | Admitting: Physical Therapy

## 2018-10-24 ENCOUNTER — Encounter: Payer: BC Managed Care – PPO | Admitting: Physical Therapy

## 2018-10-31 ENCOUNTER — Encounter: Payer: BC Managed Care – PPO | Admitting: Physical Therapy

## 2018-11-07 ENCOUNTER — Encounter: Payer: BC Managed Care – PPO | Admitting: Physical Therapy

## 2018-11-14 ENCOUNTER — Encounter: Payer: BC Managed Care – PPO | Admitting: Physical Therapy

## 2018-11-21 ENCOUNTER — Encounter: Payer: BC Managed Care – PPO | Admitting: Physical Therapy

## 2018-11-21 ENCOUNTER — Ambulatory Visit: Payer: BC Managed Care – PPO | Admitting: Urology

## 2018-11-22 ENCOUNTER — Encounter: Payer: BC Managed Care – PPO | Admitting: Physical Therapy

## 2018-11-28 ENCOUNTER — Encounter: Payer: BC Managed Care – PPO | Admitting: Physical Therapy

## 2018-12-03 ENCOUNTER — Ambulatory Visit
Admission: RE | Admit: 2018-12-03 | Discharge: 2018-12-03 | Disposition: A | Discharge status: Home / Self Care | Payer: BC Managed Care – PPO | Source: Ambulatory Visit | Attending: Urology | Admitting: Urology

## 2018-12-03 ENCOUNTER — Ambulatory Visit: Payer: BC Managed Care – PPO | Admitting: Urology

## 2018-12-03 ENCOUNTER — Encounter: Payer: Self-pay | Admitting: Urology

## 2018-12-03 VITALS — BP 132/84 | HR 83 | Ht 68.0 in | Wt 134.0 lb

## 2018-12-03 DIAGNOSIS — R309 Painful micturition, unspecified: Secondary | ICD-10-CM

## 2018-12-03 NOTE — Patient Instructions (Signed)
Dietary Guidelines to Help Prevent Kidney Stones Kidney stones are deposits of minerals and salts that form inside your kidneys. Your risk of developing kidney stones may be greater depending on your diet, your lifestyle, the medicines you take, and whether you have certain medical conditions. Most people can reduce their chances of developing kidney stones by following the instructions below. Depending on your overall health and the type of kidney stones you tend to develop, your dietitian may give you more specific instructions. What are tips for following this plan? Reading food labels  Choose foods with "no salt added" or "low-salt" labels. Limit your sodium intake to less than 1500 mg per day.  Choose foods with calcium for each meal and snack. Try to eat about 300 mg of calcium at each meal. Foods that contain 200-500 mg of calcium per serving include: ? 8 oz (237 ml) of milk, fortified nondairy milk, and fortified fruit juice. ? 8 oz (237 ml) of kefir, yogurt, and soy yogurt. ? 4 oz (118 ml) of tofu. ? 1 oz of cheese. ? 1 cup (300 g) of dried figs. ? 1 cup (91 g) of cooked broccoli. ? 1-3 oz can of sardines or mackerel.  Most people need 1000 to 1500 mg of calcium each day. Talk to your dietitian about how much calcium is recommended for you. Shopping  Buy plenty of fresh fruits and vegetables. Most people do not need to avoid fruits and vegetables, even if they contain nutrients that may contribute to kidney stones.  When shopping for convenience foods, choose: ? Whole pieces of fruit. ? Premade salads with dressing on the side. ? Low-fat fruit and yogurt smoothies.  Avoid buying frozen meals or prepared deli foods.  Look for foods with live cultures, such as yogurt and kefir. Cooking  Do not add salt to food when cooking. Place a salt shaker on the table and allow each person to add his or her own salt to taste.  Use vegetable protein, such as beans, textured vegetable  protein (TVP), or tofu instead of meat in pasta, casseroles, and soups. Meal planning   Eat less salt, if told by your dietitian. To do this: ? Avoid eating processed or premade food. ? Avoid eating fast food.  Eat less animal protein, including cheese, meat, poultry, or fish, if told by your dietitian. To do this: ? Limit the number of times you have meat, poultry, fish, or cheese each week. Eat a diet free of meat at least 2 days a week. ? Eat only one serving each day of meat, poultry, fish, or seafood. ? When you prepare animal protein, cut pieces into small portion sizes. For most meat and fish, one serving is about the size of one deck of cards.  Eat at least 5 servings of fresh fruits and vegetables each day. To do this: ? Keep fruits and vegetables on hand for snacks. ? Eat 1 piece of fruit or a handful of berries with breakfast. ? Have a salad and fruit at lunch. ? Have two kinds of vegetables at dinner.  Limit foods that are high in a substance called oxalate. These include: ? Spinach. ? Rhubarb. ? Beets. ? Potato chips and french fries. ? Nuts.  If you regularly take a diuretic medicine, make sure to eat at least 1-2 fruits or vegetables high in potassium each day. These include: ? Avocado. ? Banana. ? Orange, prune, carrot, or tomato juice. ? Baked potato. ? Cabbage. ? Beans and split   If you regularly take a diuretic medicine, make sure to eat at least 1-2 fruits or vegetables high in potassium each day. These include:  ? Avocado.  ? Banana.  ? Orange, prune, carrot, or tomato juice.  ? Baked potato.  ? Cabbage.  ? Beans and split peas.  General instructions     Drink enough fluid to keep your urine clear or pale yellow. This is the most important thing you can do.   Talk to your health care provider and dietitian about taking daily supplements. Depending on your health and the cause of your kidney stones, you may be advised:  ? Not to take supplements with vitamin C.  ? To take a calcium supplement.  ? To take a daily probiotic supplement.  ? To take other supplements such as magnesium, fish oil, or vitamin B6.   Take all medicines and supplements as told by your health care provider.   Limit alcohol intake to no  more than 1 drink a day for nonpregnant women and 2 drinks a day for men. One drink equals 12 oz of beer, 5 oz of wine, or 1 oz of hard liquor.   Lose weight if told by your health care provider. Work with your dietitian to find strategies and an eating plan that works best for you.  What foods are not recommended?  Limit your intake of the following foods, or as told by your dietitian. Talk to your dietitian about specific foods you should avoid based on the type of kidney stones and your overall health.  Grains  Breads. Bagels. Rolls. Baked goods. Salted crackers. Cereal. Pasta.  Vegetables  Spinach. Rhubarb. Beets. Canned vegetables. Pickles. Olives.  Meats and other protein foods  Nuts. Nut butters. Large portions of meat, poultry, or fish. Salted or cured meats. Deli meats. Hot dogs. Sausages.  Dairy  Cheese.  Beverages  Regular soft drinks. Regular vegetable juice.  Seasonings and other foods  Seasoning blends with salt. Salad dressings. Canned soups. Soy sauce. Ketchup. Barbecue sauce. Canned pasta sauce. Casseroles. Pizza. Lasagna. Frozen meals. Potato chips. French fries.  Summary   You can reduce your risk of kidney stones by making changes to your diet.   The most important thing you can do is drink enough fluid. You should drink enough fluid to keep your urine clear or pale yellow.   Ask your health care provider or dietitian how much protein from animal sources you should eat each day, and also how much salt and calcium you should have each day.  This information is not intended to replace advice given to you by your health care provider. Make sure you discuss any questions you have with your health care provider.  Document Released: 01/06/2011 Document Revised: 08/22/2016 Document Reviewed: 08/22/2016  Elsevier Interactive Patient Education  2019 Elsevier Inc[employer]

## 2018-12-03 NOTE — Progress Notes (Signed)
   12/03/2018 4:16 PM   Antoine Primas 1971-03-22 [id number]  Reason for visit: Follow up pelvic pain  HPI: I saw Mr. Sallye Lat in urology clinic in follow-up for severe pelvic pain and dysuria.  Briefly, I originally met him in December 2019 when he was having severe pelvic and perineal pain, as well as severe dysuria.  Cystoscopy was normal at that time.  I referred him to pelvic floor physical therapy, and he reports a massive improvement in his symptoms after working with the pelvic floor physical therapist in [place].  He denies any dysuria or pelvic pain at this time.  He does however have new right-sided flank pain that he thinks may be related to a kidney stone.  Urinalysis in urgent care on 11/30/2018 was benign.  We discussed options for work-up of this including gold standard imaging with CT scan, versus KUB which would be less expensive but less sensitive.  Like to proceed with a KUB today.  Will call with results.  No further follow-up needed from pelvic pain/dysuria perspective Will call with KUB results  A total of 10 minutes were spent face-to-face with the patient, greater than 50% was spent in patient education, counseling, and coordination of care regarding pelvic pain and nephrolithiasis.   Billey Co, [facility] [address], [place], [place] [address] [phone]

## 2018-12-04 ENCOUNTER — Telehealth: Payer: Self-pay | Admitting: *Deleted

## 2018-12-04 LAB — URINALYSIS, COMPLETE
Bilirubin, UA: NEGATIVE
GLUCOSE, UA: NEGATIVE
Ketones, UA: NEGATIVE
LEUKOCYTES UA: NEGATIVE
Nitrite, UA: NEGATIVE
Protein, UA: NEGATIVE
SPEC GRAV UA: 1.01 (ref 1.005–1.030)
Urobilinogen, Ur: 0.2 mg/dL (ref 0.2–1.0)
pH, UA: 6 (ref 5.0–7.5)

## 2018-12-04 LAB — MICROSCOPIC EXAMINATION
BACTERIA UA: NONE SEEN
Epithelial Cells (non renal): NONE SEEN /hpf (ref 0–10)
WBC, UA: NONE SEEN /hpf (ref 0–5)

## 2018-12-04 NOTE — Telephone Encounter (Signed)
-----   Message from Billey Co, MD sent at 12/04/2018  8:29 AM EDT ----- No kidney stones seen on his xray, suspect his back pain is musculoskeletal in origin. Follow up as needed  [name], MD 12/04/2018

## 2018-12-04 NOTE — Telephone Encounter (Signed)
Notified patient as instructed, patient pleased. Discussed follow-up appointments, patient agrees  

## 2018-12-05 ENCOUNTER — Encounter: Payer: BC Managed Care – PPO | Admitting: Physical Therapy

## 2018-12-12 ENCOUNTER — Encounter: Payer: BC Managed Care – PPO | Admitting: Physical Therapy

## 2018-12-19 ENCOUNTER — Encounter: Payer: BC Managed Care – PPO | Admitting: Physical Therapy

## 2018-12-26 ENCOUNTER — Encounter: Payer: BC Managed Care – PPO | Admitting: Physical Therapy

## 2019-01-02 ENCOUNTER — Encounter: Payer: BC Managed Care – PPO | Admitting: Physical Therapy

## 2019-01-09 ENCOUNTER — Encounter: Payer: BC Managed Care – PPO | Admitting: Physical Therapy

## 2019-01-16 ENCOUNTER — Encounter: Payer: BC Managed Care – PPO | Admitting: Physical Therapy

## 2019-01-23 ENCOUNTER — Encounter: Payer: BC Managed Care – PPO | Admitting: Physical Therapy

## 2019-12-01 ENCOUNTER — Ambulatory Visit: Payer: BC Managed Care – PPO | Attending: Internal Medicine

## 2019-12-01 DIAGNOSIS — Z23 Encounter for immunization: Secondary | ICD-10-CM | POA: Insufficient documentation

## 2019-12-01 NOTE — Progress Notes (Signed)
   Covid-19 Vaccination Clinic  Name:  Jesse Norman    MRN: [id number] DOB: [birth year]  12/01/2019  Mr. [name] was observed post Covid-19 immunization for 15 minutes without incident. He was provided with Vaccine Information Sheet and instruction to access the V-Safe system.   Mr. [name] was instructed to call 911 with any severe reactions post vaccine: Marland Kitchen Difficulty breathing  . Swelling of face and throat  . A fast heartbeat  . A bad rash all over body  . Dizziness and weakness   Immunizations Administered    Name Date Dose VIS Date Route   Pfizer COVID-19 Vaccine 12/01/2019  8:24 AM 0.3 mL 09/05/2019 Intramuscular   Manufacturer: [place]   Lot: [id number]   [place]: [id number]

## 2019-12-24 ENCOUNTER — Ambulatory Visit: Payer: BC Managed Care – PPO

## 2019-12-27 ENCOUNTER — Ambulatory Visit: Payer: BC Managed Care – PPO

## 2019-12-29 ENCOUNTER — Ambulatory Visit: Payer: BC Managed Care – PPO | Attending: Internal Medicine

## 2019-12-29 DIAGNOSIS — Z23 Encounter for immunization: Secondary | ICD-10-CM

## 2019-12-29 NOTE — Progress Notes (Signed)
   Covid-19 Vaccination Clinic  Name:  LOWE MCBRATNEY    MRN: [id number] DOB: [birth year]  12/29/2019  Mr. Wolbers was observed post Covid-19 immunization for 15 minutes without incident. He was provided with Vaccine Information Sheet and instruction to access the V-Safe system.   Mr. Szekely was instructed to call 911 with any severe reactions post vaccine: Marland Kitchen Difficulty breathing  . Swelling of face and throat  . A fast heartbeat  . A bad rash all over body  . Dizziness and weakness   Immunizations Administered    Name Date Dose VIS Date Route   Pfizer COVID-19 Vaccine 12/29/2019 10:32 AM 0.3 mL 09/05/2019 Intramuscular   Manufacturer: [place]   Lot: [id number]   [place]: [id number]

## 2021-09-30 ENCOUNTER — Other Ambulatory Visit: Payer: BC Managed Care – PPO

## 2021-10-11 ENCOUNTER — Other Ambulatory Visit: Payer: Self-pay

## 2021-10-11 ENCOUNTER — Ambulatory Visit (LOCAL_COMMUNITY_HEALTH_CENTER): Payer: Self-pay

## 2021-10-11 DIAGNOSIS — Z111 Encounter for screening for respiratory tuberculosis: Secondary | ICD-10-CM

## 2021-10-12 DIAGNOSIS — I493 Ventricular premature depolarization: Secondary | ICD-10-CM | POA: Insufficient documentation

## 2021-10-12 HISTORY — DX: Ventricular premature depolarization: I49.3

## 2021-10-14 ENCOUNTER — Ambulatory Visit (LOCAL_COMMUNITY_HEALTH_CENTER): Payer: Self-pay

## 2021-10-14 ENCOUNTER — Other Ambulatory Visit: Payer: Self-pay

## 2021-10-14 DIAGNOSIS — Z111 Encounter for screening for respiratory tuberculosis: Secondary | ICD-10-CM

## 2021-10-14 LAB — TB SKIN TEST
Induration: 0 mm
TB Skin Test: NEGATIVE

## 2021-11-02 ENCOUNTER — Ambulatory Visit: Payer: BC Managed Care – PPO | Admitting: Internal Medicine

## 2021-11-22 ENCOUNTER — Other Ambulatory Visit: Payer: Self-pay | Admitting: Surgery

## 2021-11-22 DIAGNOSIS — K8050 Calculus of bile duct without cholangitis or cholecystitis without obstruction: Secondary | ICD-10-CM

## 2021-11-30 ENCOUNTER — Other Ambulatory Visit: Payer: Self-pay

## 2021-11-30 ENCOUNTER — Ambulatory Visit
Admission: RE | Admit: 2021-11-30 | Discharge: 2021-11-30 | Disposition: A | Discharge status: Home / Self Care | Payer: BC Managed Care – PPO | Source: Ambulatory Visit | Attending: Surgery | Admitting: Surgery

## 2021-11-30 DIAGNOSIS — K8050 Calculus of bile duct without cholangitis or cholecystitis without obstruction: Secondary | ICD-10-CM | POA: Insufficient documentation

## 2022-05-25 ENCOUNTER — Ambulatory Visit: Payer: BC Managed Care – PPO | Admitting: Nurse Practitioner

## 2022-06-21 ENCOUNTER — Ambulatory Visit: Payer: BC Managed Care – PPO | Admitting: Nurse Practitioner

## 2022-06-21 ENCOUNTER — Encounter: Payer: Self-pay | Admitting: Nurse Practitioner

## 2022-06-21 VITALS — BP 122/68 | HR 68 | Temp 97.5°F | Ht 67.5 in | Wt 137.0 lb

## 2022-06-21 DIAGNOSIS — R1011 Right upper quadrant pain: Secondary | ICD-10-CM | POA: Diagnosis not present

## 2022-06-21 DIAGNOSIS — K219 Gastro-esophageal reflux disease without esophagitis: Secondary | ICD-10-CM

## 2022-06-21 DIAGNOSIS — Z0000 Encounter for general adult medical examination without abnormal findings: Secondary | ICD-10-CM

## 2022-06-21 DIAGNOSIS — Z87442 Personal history of urinary calculi: Secondary | ICD-10-CM | POA: Diagnosis not present

## 2022-06-21 DIAGNOSIS — Z1211 Encounter for screening for malignant neoplasm of colon: Secondary | ICD-10-CM | POA: Diagnosis not present

## 2022-06-21 DIAGNOSIS — R319 Hematuria, unspecified: Secondary | ICD-10-CM | POA: Diagnosis not present

## 2022-06-21 DIAGNOSIS — D649 Anemia, unspecified: Secondary | ICD-10-CM

## 2022-06-21 DIAGNOSIS — Z125 Encounter for screening for malignant neoplasm of prostate: Secondary | ICD-10-CM

## 2022-06-21 LAB — POCT URINALYSIS DIPSTICK
Bilirubin, UA: NEGATIVE
Glucose, UA: NEGATIVE
Ketones, UA: NEGATIVE
Leukocytes, UA: NEGATIVE
Nitrite, UA: NEGATIVE
Protein, UA: NEGATIVE
Spec Grav, UA: 1.01 (ref 1.010–1.025)
Urobilinogen, UA: NEGATIVE E.U./dL — AB
pH, UA: 6.5 (ref 5.0–8.0)

## 2022-06-21 MED ORDER — PANTOPRAZOLE SODIUM 40 MG PO TBEC: 40.0000 mg | DELAYED_RELEASE_TABLET | Freq: Every day | ORAL | 3 refills | Status: DC

## 2022-06-21 NOTE — Progress Notes (Signed)
New Patient Office Visit  Subjective    Patient ID: Jesse Norman, male    DOB: 02/26/1971  Age: 51 y.o. MRN: [id number]  CC:  Chief Complaint  Patient presents with   RUQ abdominal pain    HPI [name] presents to establish care. This is his initial visit to the office. Reports chronic abd pain.  Abdominal Pain  He reports chronic intermittent abdominal pain. The most recent episode started several months ago and is gradually worsening. The abdominal pain is located in the right upper quadrant and epigastrium area. It is described as dull, sharp, and shooting, is 4/10 or 5/10 in intensity, occurring intermittently. It is aggravated by moving and is relieved by stretching. He has tried  physical therapy, heating pad, and warm bath with moderate relief. Pt denies fever, diarrhea, unintentional weight loss, correlation of abd pain with certain foods. He has modified his diet and decreased fatty foods. Report some weight loss related to dietary changes. Denies GERD, abd masses or bulges, previous hernias. Pt has modified his exercise routine, decreased heavy lifting and running. He has not undergone screening colonoscopy. Reports a history of kidney stones and chronic hematuria; pushes fluids, especially water.    Recent GI studies:ultrasound of abdomen-negative   Previous labs  Lab Results  Component Value Date   GLUCOSE 128 (H) 12/14/2017   NA 134 (L) 12/14/2017   K 3.9 12/14/2017   CL 99 (L) 12/14/2017   CO2 27 12/14/2017   BUN 13 12/14/2017   CREATININE 1.04 12/14/2017   GFRNONAA >60 12/14/2017   GFRAA >60 12/14/2017   CALCIUM 9.1 12/14/2017   PROT 7.6 12/14/2017   ALBUMIN 4.2 12/14/2017   BILITOT 0.8 12/14/2017   ALKPHOS 47 12/14/2017   AST 24 12/14/2017   ALT 14 (L) 12/14/2017   ANIONGAP 8 12/14/2017     Outpatient Encounter Medications as of 06/21/2022  Medication Sig   [DISCONTINUED] clonazePAM (KLONOPIN) 0.5 MG tablet Take 0.25 mg by mouth daily. (Patient  not taking: Reported on 10/11/2021)   [DISCONTINUED] diazepam (VALIUM) 5 MG tablet Take 1 tablet (5 mg total) by mouth once as needed for up to 1 dose for anxiety. Take 30 minutes prior to cystoscopy (Patient not taking: Reported on 12/03/2018)   [DISCONTINUED] hydrOXYzine (VISTARIL) 25 MG capsule TK ONE C PO TID PRA (Patient not taking: Reported on 10/11/2021)   [DISCONTINUED] ibuprofen (ADVIL,MOTRIN) 200 MG tablet Take 200 mg by mouth every 6 (six) hours as needed. (Patient not taking: Reported on 10/11/2021)   [DISCONTINUED] sertraline (ZOLOFT) 50 MG tablet Take 50 mg by mouth daily. (Patient not taking: Reported on 10/11/2021)   No facility-administered encounter medications on file as of 06/21/2022.    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    Palpitations     Past Surgical History:  Procedure Laterality Date   CYSTOSCOPY  2020   skin cancer removal  2018   basal cell    Family History  Problem Relation Age of Onset   Healthy Mother    Melanoma Father    Hypertension Sister    Healthy Brother    Heart attack Maternal Grandfather     Social History   Socioeconomic History   Marital status: Divorced    Spouse name: Not on file   Number of children: Not on file  Years of education: Not on file   Highest education level: Not on file  Occupational History   Not on file  Tobacco Use   Smoking status: Never   Smokeless tobacco: Never  Vaping Use   Vaping Use: Never used  Substance and Sexual Activity   Alcohol use: Yes    Comment: social drinker   Drug use: No   Sexual activity: Not Currently    Partners: Female  Other Topics Concern   Not on file  Social History Narrative   Not on file   Social Determinants of Health   Financial Resource Strain: Not on file  Food Insecurity: Not on file  Transportation Needs: Not on file  Physical Activity: Not on file   Stress: Not on file  Social Connections: Not on file  Intimate Partner Violence: Not on file    Review of Systems  Constitutional:  Negative for fever and malaise/fatigue.  HENT:  Negative for congestion, ear pain, sinus pain and sore throat.   Respiratory:  Negative for cough, shortness of breath and wheezing.   Cardiovascular:  Negative for chest pain and leg swelling.  Gastrointestinal:  Negative for abdominal pain, diarrhea, heartburn, nausea and vomiting.  Genitourinary:  Negative for dysuria, frequency and urgency.  Musculoskeletal:  Negative for back pain, falls, joint pain and myalgias.  Neurological:  Negative for dizziness, weakness and headaches.  Psychiatric/Behavioral:  Negative for depression. The patient is not nervous/anxious.         Objective    BP 122/68 (BP Location: Left Arm, Patient Position: Sitting)   Pulse 68   Temp (!) 97.5 F (36.4 C) (Temporal)   Ht 5' 7.5" (1.715 m)   Wt 137 lb (62.1 kg)   SpO2 99%   BMI 21.14 kg/m   Physical Exam Vitals reviewed.  Constitutional:      Appearance: Normal appearance. He is normal weight.  Cardiovascular:     Rate and Rhythm: Normal rate and regular rhythm.     Heart sounds: Normal heart sounds.  Pulmonary:     Effort: Pulmonary effort is normal.     Breath sounds: Normal breath sounds.  Abdominal:     General: Abdomen is flat. Bowel sounds are normal.     Palpations: Abdomen is soft.  Neurological:     Mental Status: He is alert and oriented to person, place, and time.  Psychiatric:        Mood and Affect: Mood normal.        Behavior: Behavior normal.          Assessment & Plan:      1. RUQ pain - CBC with Differential/Platelet - Comprehensive metabolic panel - Food Allergy Profile  2. History of kidney stones - CBC with Differential/Platelet - Comprehensive metabolic panel - POCT urinalysis dipstick - Urine Culture  3. Anemia, unspecified type - CBC with Differential/Platelet -  Comprehensive metabolic panel  4. Encounter for screening for malignant neoplasm of colon - Ambulatory referral to Gastroenterology  5. Encounter for screening for malignant neoplasm of prostate - PSA  6. Encounter for medical examination to establish care - CBC with Differential/Platelet - Comprehensive metabolic panel  7. Hematuria, unspecified type - CBC with Differential/Platelet - Comprehensive metabolic panel - PSA - POCT urinalysis dipstick - Urine Culture  8. Gastroesophageal reflux disease, unspecified whether esophagitis present - pantoprazole (PROTONIX) 40 MG tablet; Take 1 tablet (40 mg total) by mouth daily.  Dispense: 30 tablet; Refill: 3 - Food Allergy Profile  We will call you with lab results and GI referral appointment Begin Protonix 40 mg daily for GERD Avoid heavy lifting or straining Follow-up in 2 weeks  Follow-up: 2 weeks  I,Lauren M Auman,acting as a scribe for [employer], NP.,have documented all relevant documentation on the behalf of Rip Harbour, NP,as directed by  Rip Harbour, NP while in the presence of Rip Harbour, NP.  Oleta Mouse, CMA   I, Rip Harbour, NP, have reviewed all documentation for this visit. The documentation on 06/21/22 for the exam, diagnosis, procedures, and orders are all accurate and complete.    SignedJerrell Belfast, DNP 06/21/22 at 9:29 pm

## 2022-06-21 NOTE — Patient Instructions (Addendum)
We will call you with lab results and GI referral appointment Begin Protonix 40 mg daily for GERD Avoid heavy lifting or straining Follow-up in 2 weeks    Hematuria, Adult Hematuria is blood in the urine. Blood may be visible in the urine, or it may be identified with a test. This condition can be caused by infections of the bladder, urethra, kidney, or prostate. Other possible causes include: Kidney stones. Cancer of the urinary tract. Too much calcium in the urine. Conditions that are passed from parent to child (inherited conditions). Exercise that requires a lot of energy. Infections can usually be treated with medicine, and a kidney stone usually will pass through your urine. If neither of these is the cause of your hematuria, more tests may be needed to identify the cause of your symptoms. It is very important to tell your health care provider about any blood in your urine, even if it is painless or the blood stops without treatment. Blood in the urine, when it happens and then stops and then happens again, can be a symptom of a very serious condition, including cancer. There is no pain in the initial stages of many urinary cancers. Follow these instructions at home: Medicines Take over-the-counter and prescription medicines only as told by your health care provider. If you were prescribed an antibiotic medicine, take it as told by your health care provider. Do not stop taking the antibiotic even if you start to feel better. Eating and drinking Drink enough fluid to keep your urine pale yellow. It is recommended that you drink 3-4 quarts (2.8-3.8 L) a day. If you have been diagnosed with an infection, drinking cranberry juice in addition to large amounts of water is recommended. Avoid caffeine, tea, and carbonated beverages. These tend to irritate the bladder. Avoid alcohol because it may irritate the prostate (in males). General instructions If you have been diagnosed with a kidney  stone, follow your health care provider's instructions about straining your urine to catch the stone. Empty your bladder often. Avoid holding urine for long periods of time. If you are male: After a bowel movement, wipe from front to back and use each piece of toilet paper only once. Empty your bladder before and after sex. Pay attention to any changes in your symptoms. Tell your health care provider about any changes or any new symptoms. It is up to you to get the results of any tests. Ask your health care provider, or the department that is doing the test, when your results will be ready. Keep all follow-up visits. This is important. Contact a health care provider if: You develop back pain. You have a fever or chills. You have nausea or vomiting. Your symptoms do not improve after 3 days. Your symptoms get worse. Get help right away if: You develop severe vomiting and are unable to take medicine without vomiting. You develop severe pain in your back or abdomen even though you are taking medicine. You pass a large amount of blood in your urine. You pass blood clots in your urine. You feel very weak or like you might faint. You faint. Summary Hematuria is blood in the urine. It has many possible causes. It is very important that you tell your health care provider about any blood in your urine, even if it is painless or the blood stops without treatment. Take over-the-counter and prescription medicines only as told by your health care provider. Drink enough fluid to keep your urine pale yellow. This information  is not intended to replace advice given to you by your health care provider. Make sure you discuss any questions you have with your health care provider. Document Revised: 05/12/2020 Document Reviewed: 05/12/2020 Elsevier Patient Education  [place]. Abdominal Pain, Adult Many things can cause belly (abdominal) pain. Most times, belly pain is not dangerous. Many cases  of belly pain can be watched and treated at home. Sometimes, though, belly pain is serious. Your doctor will try to find the cause of your belly pain. Follow these instructions at home:  Medicines Take over-the-counter and prescription medicines only as told by your doctor. Do not take medicines that help you poop (laxatives) unless told by your doctor. General instructions Watch your belly pain for any changes. Drink enough fluid to keep your pee (urine) pale yellow. Keep all follow-up visits as told by your doctor. This is important. Contact a doctor if: Your belly pain changes or gets worse. You are not hungry, or you lose weight without trying. You are having trouble pooping (constipated) or have watery poop (diarrhea) for more than 2-3 days. You have pain when you pee or poop. Your belly pain wakes you up at night. Your pain gets worse with meals, after eating, or with certain foods. You are vomiting and cannot keep anything down. You have a fever. You have blood in your pee. Get help right away if: Your pain does not go away as soon as your doctor says it should. You cannot stop vomiting. Your pain is only in areas of your belly, such as the right side or the left lower part of the belly. You have bloody or black poop, or poop that looks like tar. You have very bad pain, cramping, or bloating in your belly. You have signs of not having enough fluid or water in your body (dehydration), such as: Dark pee, very little pee, or no pee. Cracked lips. Dry mouth. Sunken eyes. Sleepiness. Weakness. You have trouble breathing or chest pain. Summary Many cases of belly pain can be watched and treated at home. Watch your belly pain for any changes. Take over-the-counter and prescription medicines only as told by your doctor. Contact a doctor if your belly pain changes or gets worse. Get help right away if you have very bad pain, cramping, or bloating in your belly. This information  is not intended to replace advice given to you by your health care provider. Make sure you discuss any questions you have with your health care provider. Document Revised: 01/20/2019 Document Reviewed: 01/20/2019 Elsevier Patient Education  [place].

## 2022-06-22 LAB — PSA: Prostate Specific Ag, Serum: 0.3 ng/mL (ref 0.0–4.0)

## 2022-06-22 LAB — CBC WITH DIFFERENTIAL/PLATELET
Basophils Absolute: 0.1 10*3/uL (ref 0.0–0.2)
Basos: 1 %
EOS (ABSOLUTE): 0 10*3/uL (ref 0.0–0.4)
Eos: 1 %
Hematocrit: 39.5 % (ref 37.5–51.0)
Hemoglobin: 13 g/dL (ref 13.0–17.7)
Immature Grans (Abs): 0 10*3/uL (ref 0.0–0.1)
Immature Granulocytes: 0 %
Lymphocytes Absolute: 1.5 10*3/uL (ref 0.7–3.1)
Lymphs: 28 %
MCH: 28.5 pg (ref 26.6–33.0)
MCHC: 32.9 g/dL (ref 31.5–35.7)
MCV: 87 fL (ref 79–97)
Monocytes Absolute: 1 10*3/uL — ABNORMAL HIGH (ref 0.1–0.9)
Monocytes: 20 %
Neutrophils Absolute: 2.7 10*3/uL (ref 1.4–7.0)
Neutrophils: 50 %
Platelets: 278 10*3/uL (ref 150–450)
RBC: 4.56 x10E6/uL (ref 4.14–5.80)
RDW: 12 % (ref 11.6–15.4)
WBC: 5.3 10*3/uL (ref 3.4–10.8)

## 2022-06-22 LAB — URINE CULTURE: Organism ID, Bacteria: NO GROWTH

## 2022-06-22 LAB — COMPREHENSIVE METABOLIC PANEL
ALT: 16 IU/L (ref 0–44)
AST: 22 IU/L (ref 0–40)
Albumin/Globulin Ratio: 2 (ref 1.2–2.2)
Albumin: 4.7 g/dL (ref 3.8–4.9)
Alkaline Phosphatase: 51 IU/L (ref 44–121)
BUN/Creatinine Ratio: 10 (ref 9–20)
BUN: 11 mg/dL (ref 6–24)
Bilirubin Total: 0.3 mg/dL (ref 0.0–1.2)
CO2: 25 mmol/L (ref 20–29)
Calcium: 9.5 mg/dL (ref 8.7–10.2)
Chloride: 98 mmol/L (ref 96–106)
Creatinine, Ser: 1.12 mg/dL (ref 0.76–1.27)
Globulin, Total: 2.4 g/dL (ref 1.5–4.5)
Glucose: 83 mg/dL (ref 70–99)
Potassium: 4.3 mmol/L (ref 3.5–5.2)
Sodium: 137 mmol/L (ref 134–144)
Total Protein: 7.1 g/dL (ref 6.0–8.5)
eGFR: 79 mL/min/{1.73_m2} (ref >59)

## 2022-06-25 LAB — FOOD ALLERGY PROFILE
Allergen Corn, IgE: <0.1 kU/L
Clam IgE: <0.1 kU/L
Codfish IgE: <0.1 kU/L
Egg White IgE: <0.1 kU/L
Milk IgE: <0.1 kU/L
Peanut IgE: <0.1 kU/L
Scallop IgE: <0.1 kU/L
Sesame Seed IgE: <0.1 kU/L
Shrimp IgE: <0.1 kU/L
Soybean IgE: <0.1 kU/L
Walnut IgE: <0.1 kU/L
Wheat IgE: <0.1 kU/L

## 2022-06-25 LAB — SPECIMEN STATUS REPORT

## 2022-06-30 ENCOUNTER — Other Ambulatory Visit (HOSPITAL_COMMUNITY): Payer: Self-pay | Admitting: Surgery

## 2022-06-30 ENCOUNTER — Other Ambulatory Visit: Payer: Self-pay | Admitting: Surgery

## 2022-06-30 DIAGNOSIS — K8050 Calculus of bile duct without cholangitis or cholecystitis without obstruction: Secondary | ICD-10-CM

## 2022-07-05 ENCOUNTER — Ambulatory Visit: Payer: BC Managed Care – PPO | Admitting: Nurse Practitioner

## 2022-07-11 ENCOUNTER — Other Ambulatory Visit: Payer: BC Managed Care – PPO

## 2022-07-17 ENCOUNTER — Encounter
Admission: RE | Admit: 2022-07-17 | Discharge: 2022-07-17 | Disposition: A | Discharge status: Home / Self Care | Payer: BC Managed Care – PPO | Source: Ambulatory Visit | Attending: Surgery | Admitting: Surgery

## 2022-07-17 DIAGNOSIS — K8050 Calculus of bile duct without cholangitis or cholecystitis without obstruction: Secondary | ICD-10-CM | POA: Diagnosis present

## 2022-07-17 MED ORDER — TECHNETIUM TC 99M MEBROFENIN IV KIT
5.0500 | PACK | Freq: Once | INTRAVENOUS | Status: AC | PRN
  Administered 2022-07-17: 5.05 via INTRAVENOUS

## 2022-07-20 ENCOUNTER — Ambulatory Visit: Payer: BC Managed Care – PPO | Admitting: Nurse Practitioner

## 2022-09-06 ENCOUNTER — Encounter: Payer: Self-pay | Admitting: Nurse Practitioner

## 2022-09-06 LAB — HM COLONOSCOPY

## 2022-09-07 ENCOUNTER — Telehealth: Payer: Self-pay

## 2022-09-07 NOTE — Telephone Encounter (Signed)
Jesse Norman called about wanting to get an upper GI done.  He has recently seen GI and had a colonoscopy.  He is going to call GI and discuss a follow-up with them.

## 2022-09-08 IMAGING — US US ABDOMEN LIMITED
1 series · 14 of 25 positions shown · non-contrast
Comparison: CT 08/04/2009.

CLINICAL DATA: Biliary colic.

EXAM:
ULTRASOUND ABDOMEN LIMITED RIGHT UPPER QUADRANT

[Series 1: us abdomen limited · 0.22mm/px · 14 of 66 slices shown]
[im 1/66]
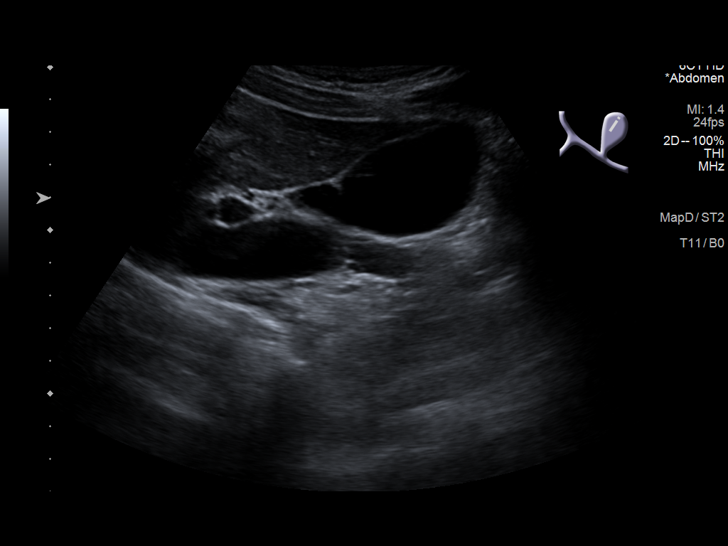
[im 6/66]
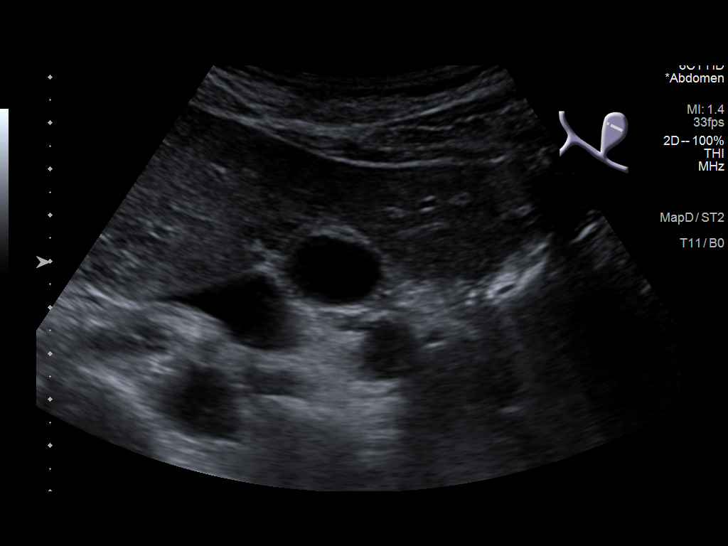
[im 11/66]
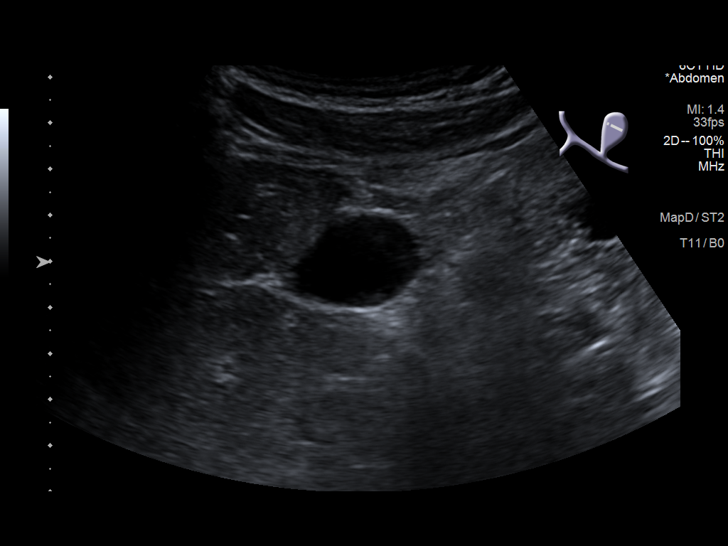
[im 17/66]
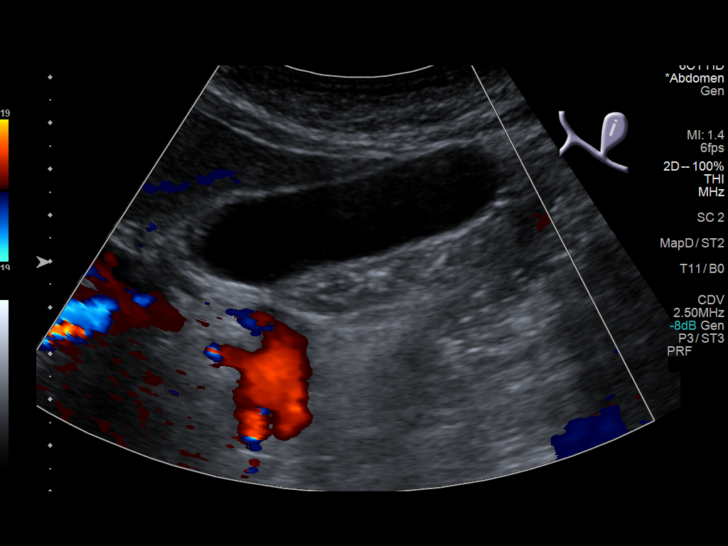
[im 22/66]
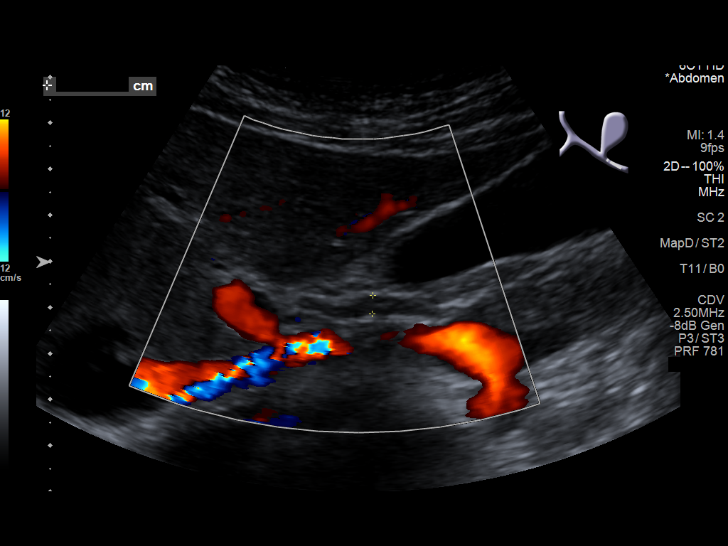
[im 25/66]
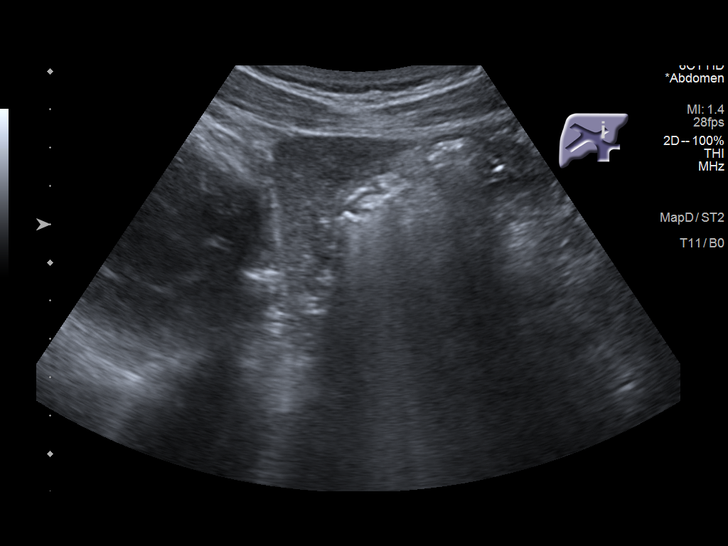
[im 30/66]
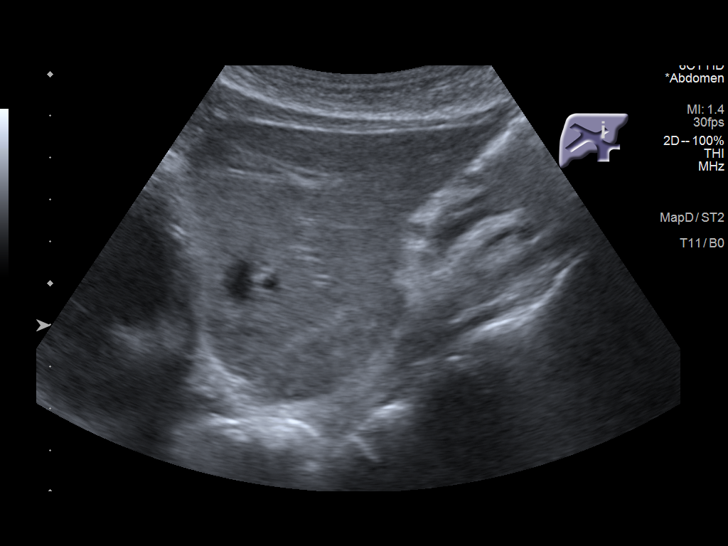
[im 36/66]
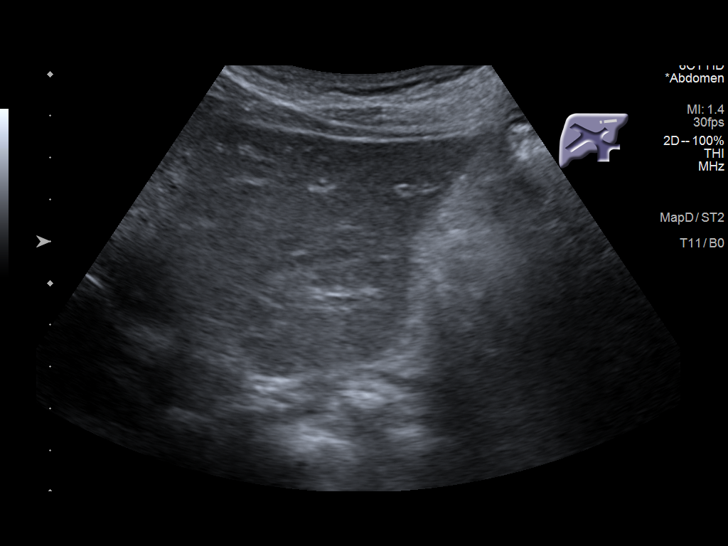
[im 41/66]
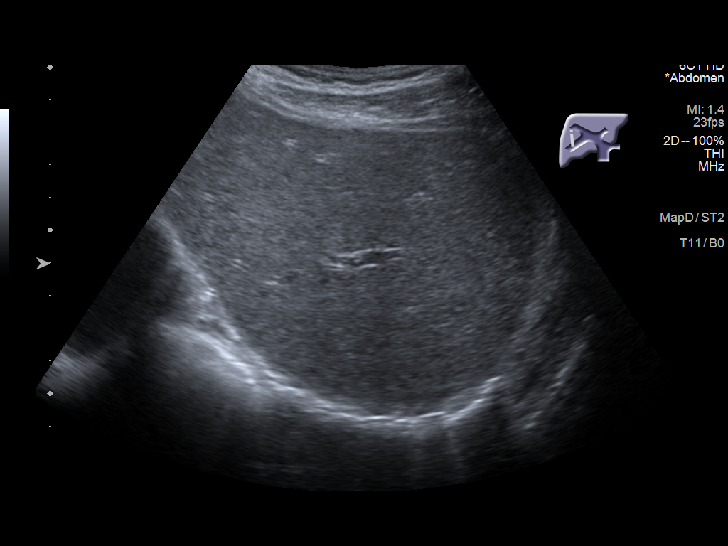
[im 44/66]
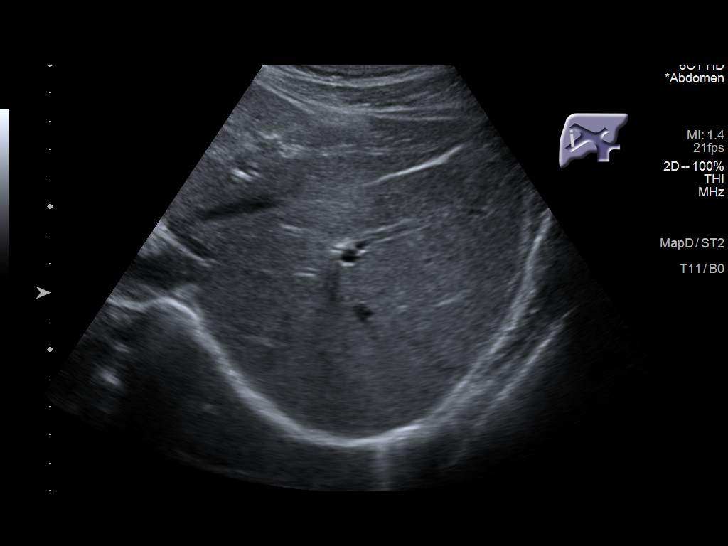
[im 49/66]
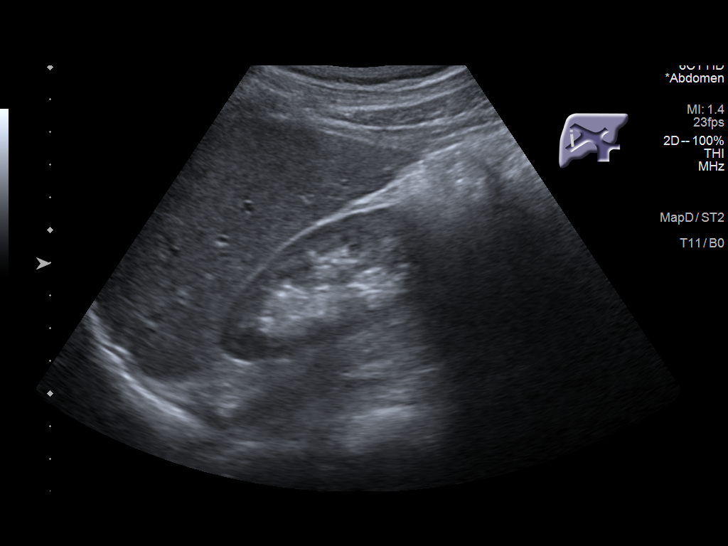
[im 55/66]
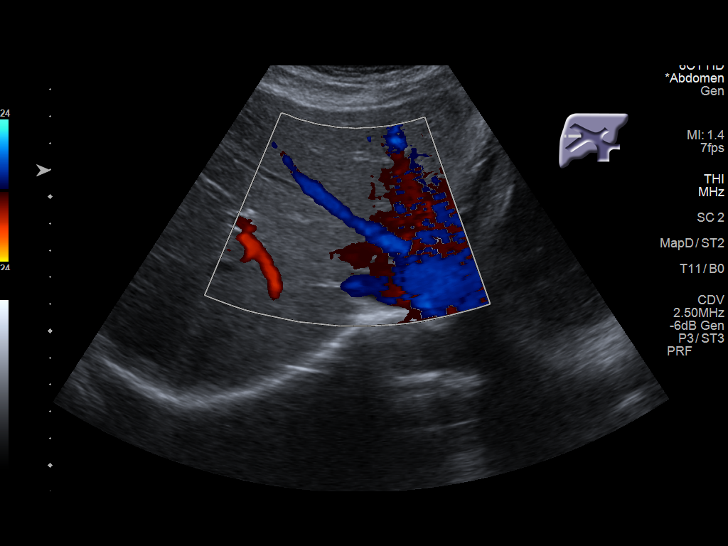
[im 60/66]
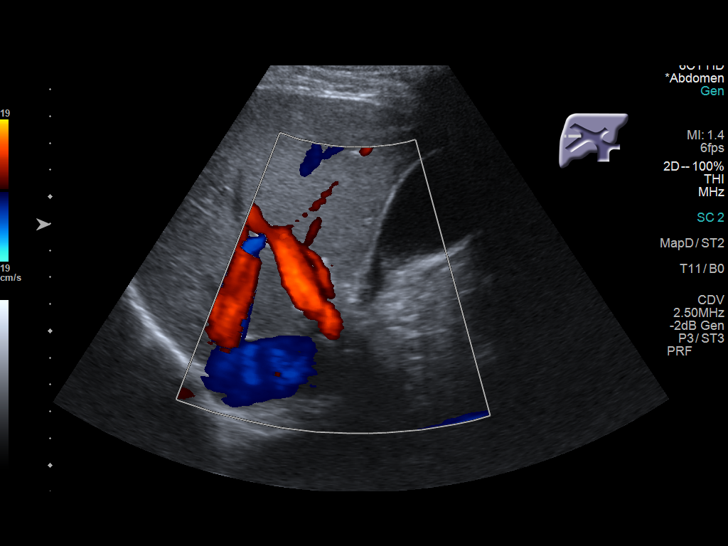
[im 66/66]
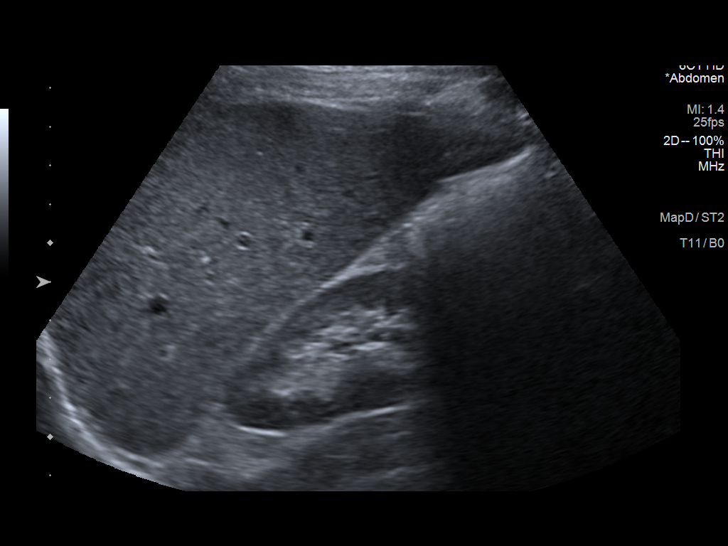

[14 of 25 positions shown; findings below may reference images not displayed]

FINDINGS: Gallbladder:

No gallstones or wall thickening visualized. No sonographic Murphy
sign noted by sonographer.

Common bile duct:

Diameter: 4 mm

Liver:

No focal lesion identified. Within normal limits in parenchymal
echogenicity. Portal vein is patent on color Doppler imaging with
normal direction of blood flow towards the liver.

Other: None.
IMPRESSION: Negative exam. No gallstones or biliary distention. No focal hepatic
abnormality identified.

## 2022-09-26 ENCOUNTER — Other Ambulatory Visit: Payer: Self-pay

## 2022-09-26 ENCOUNTER — Emergency Department
Admission: EM | Admit: 2022-09-26 | Discharge: 2022-09-26 | Disposition: A | Discharge status: Home / Self Care | Payer: BC Managed Care – PPO | Attending: Emergency Medicine | Admitting: Emergency Medicine

## 2022-09-26 ENCOUNTER — Encounter: Payer: Self-pay | Admitting: Emergency Medicine

## 2022-09-26 ENCOUNTER — Emergency Department: Payer: BC Managed Care – PPO

## 2022-09-26 DIAGNOSIS — R109 Unspecified abdominal pain: Secondary | ICD-10-CM | POA: Diagnosis present

## 2022-09-26 DIAGNOSIS — R1011 Right upper quadrant pain: Secondary | ICD-10-CM | POA: Diagnosis not present

## 2022-09-26 LAB — CBC
HCT: 41.7 % (ref 39.0–52.0)
Hemoglobin: 13.7 g/dL (ref 13.0–17.0)
MCH: 29.1 pg (ref 26.0–34.0)
MCHC: 32.9 g/dL (ref 30.0–36.0)
MCV: 88.5 fL (ref 80.0–100.0)
Platelets: 303 10*3/uL (ref 150–400)
RBC: 4.71 MIL/uL (ref 4.22–5.81)
RDW: 12.5 % (ref 11.5–15.5)
WBC: 5.9 10*3/uL (ref 4.0–10.5)
nRBC: 0 % (ref 0.0–0.2)

## 2022-09-26 LAB — COMPREHENSIVE METABOLIC PANEL
ALT: 22 U/L (ref 0–44)
AST: 25 U/L (ref 15–41)
Albumin: 4.5 g/dL (ref 3.5–5.0)
Alkaline Phosphatase: 46 U/L (ref 38–126)
Anion gap: 7 (ref 5–15)
BUN: 12 mg/dL (ref 6–20)
CO2: 28 mmol/L (ref 22–32)
Calcium: 9.4 mg/dL (ref 8.9–10.3)
Chloride: 103 mmol/L (ref 98–111)
Creatinine, Ser: 1.03 mg/dL (ref 0.61–1.24)
GFR, Estimated: >60 mL/min (ref >60)
Glucose, Bld: 107 mg/dL — ABNORMAL HIGH (ref 70–99)
Potassium: 4.1 mmol/L (ref 3.5–5.1)
Sodium: 138 mmol/L (ref 135–145)
Total Bilirubin: 0.9 mg/dL (ref 0.3–1.2)
Total Protein: 7.7 g/dL (ref 6.5–8.1)

## 2022-09-26 LAB — URINALYSIS, ROUTINE W REFLEX MICROSCOPIC
Bilirubin Urine: NEGATIVE
Glucose, UA: NEGATIVE mg/dL
Hgb urine dipstick: NEGATIVE
Ketones, ur: NEGATIVE mg/dL
Leukocytes,Ua: NEGATIVE
Nitrite: NEGATIVE
Protein, ur: NEGATIVE mg/dL
Specific Gravity, Urine: 1.002 — ABNORMAL LOW (ref 1.005–1.030)
pH: 7 (ref 5.0–8.0)

## 2022-09-26 LAB — LIPASE, BLOOD: Lipase: 35 U/L (ref 11–51)

## 2022-09-26 MED ORDER — IOHEXOL 300 MG/ML  SOLN
100.0000 mL | Freq: Once | INTRAMUSCULAR | Status: AC | PRN
  Administered 2022-09-26: 80 mL via INTRAVENOUS

## 2022-09-26 MED ORDER — OXYCODONE-ACETAMINOPHEN 5-325 MG PO TABS: 1.0000 | ORAL_TABLET | ORAL | 0 refills | Status: DC | PRN

## 2022-09-26 MED ORDER — ONDANSETRON 4 MG PO TBDP: 4.0000 mg | ORAL_TABLET | Freq: Three times a day (TID) | ORAL | 0 refills | Status: DC | PRN

## 2022-09-26 MED ORDER — KETOROLAC TROMETHAMINE 30 MG/ML IJ SOLN
30.0000 mg | Freq: Once | INTRAMUSCULAR | Status: AC
  Administered 2022-09-26: 30 mg via INTRAVENOUS
  Filled 2022-09-26: qty 1

## 2022-09-26 NOTE — ED Notes (Signed)
Pt sitting calmly on stretcher, skin dry, resp reg/unlabored, no vomiting noted currently.

## 2022-09-26 NOTE — ED Provider Notes (Signed)
[facility] Provider Note    Event Date/Time   First MD Initiated Contact with Patient 09/26/22 1059     (approximate)   History   Abdominal Pain   HPI  Jesse Norman is a 52 y.o. male who presents with complaints of right-sided abdominal pain.  Patient reports intermittent right side abdominal pain for nearly a year now.  He has had a colonoscopy, had HIDA scan which was unremarkable.  He reports last night the pain kept him up which is unusual.  He describes right-sided abdominal pain not particularly flank pain, no groin pain.  No vomiting, does report some issues with constipation which are mild.     Physical Exam   Triage Vital Signs: ED Triage Vitals  Enc Vitals Group     BP 09/26/22 1011 131/86     Pulse Rate 09/26/22 1011 75     Resp 09/26/22 1011 20     Temp 09/26/22 1011 98.4 F (36.9 C)     Temp Source 09/26/22 1011 Oral     SpO2 09/26/22 1011 99 %     Weight 09/26/22 1009 59.9 kg (132 lb)     Height 09/26/22 1009 1.727 m ('5\' 8"'$ )     Head Circumference --      Peak Flow --      Pain Score 09/26/22 1009 3     Pain Loc --      Pain Edu? --      Excl. in [place]? --     Most recent vital signs: Vitals:   09/26/22 1212 09/26/22 1300  BP: 131/85 (!) 141/73  Pulse: 77 82  Resp: 17 16  Temp:    SpO2: 100% 100%     General: Awake, no distress.  CV:  Good peripheral perfusion.  Resp:  Normal effort.  Abd:  No distention.  Soft, nontender Other:     ED Results / Procedures / Treatments   Labs (all labs ordered are listed, but only abnormal results are displayed) Labs Reviewed  COMPREHENSIVE METABOLIC PANEL - Abnormal; Notable for the following components:      Result Value   Glucose, Bld 107 (*)    All other components within normal limits  URINALYSIS, ROUTINE W REFLEX MICROSCOPIC - Abnormal; Notable for the following components:   Color, Urine COLORLESS (*)    APPearance CLEAR (*)    Specific Gravity, Urine 1.002 (*)     All other components within normal limits  LIPASE, BLOOD  CBC     EKG     RADIOLOGY CT abd/pel viewed/interpreted by me no acute abnormality    PROCEDURES:  Critical Care performed:   Procedures   MEDICATIONS ORDERED IN ED: Medications  ketorolac (TORADOL) 30 MG/ML injection 30 mg (30 mg Intravenous Given 09/26/22 1208)  iohexol (OMNIPAQUE) 300 MG/ML solution 100 mL (80 mLs Intravenous Contrast Given 09/26/22 1233)     IMPRESSION / MDM / ASSESSMENT AND PLAN / ED COURSE  I reviewed the triage vital signs and the nursing notes. Patient's presentation is most consistent with acute presentation with potential threat to life or bodily function.  Patient presents with abdominal pain as described above.  Right-sided pain, differential includes biliary colic, appendicitis, ureterolithiasis,  Reviewed lab work and is overall reassuring, will treat with IV Toradol, obtain CT abdomen pelvis to evaluate further   CT scan is normal. Patient has GI appt in 1 day. Will rx analgesics, zofran. Considered admission but given close gi f/u appropriate  for d/c       FINAL CLINICAL IMPRESSION(S) / ED DIAGNOSES   Final diagnoses:  Right upper quadrant abdominal pain     Rx / DC Orders   ED Discharge Orders          Ordered    oxyCODONE-acetaminophen (PERCOCET) 5-325 MG tablet  Every 4 hours PRN        09/26/22 1311    ondansetron (ZOFRAN-ODT) 4 MG disintegrating tablet  Every 8 hours PRN        09/26/22 1311             Note:  This document was prepared using Dragon voice recognition software and may include unintentional dictation errors.   [name], MD 09/26/22 1452

## 2022-09-26 NOTE — ED Triage Notes (Signed)
Pt via POV from [facility]. Pt c/o intermittent R sided abd pain for the past 10 months, states that last night it had gotten worse. Denies NVD. Denies fever. Denies abd surgeries. Pt is A&OX4 and NAD

## 2022-09-26 NOTE — ED Notes (Signed)
See triage note  Presents with intermittent abd pain for several months   State [place] became worse last pm  No fever or n/v

## 2023-02-16 DIAGNOSIS — F32A Depression, unspecified: Secondary | ICD-10-CM | POA: Insufficient documentation

## 2023-02-16 DIAGNOSIS — R002 Palpitations: Secondary | ICD-10-CM | POA: Insufficient documentation

## 2023-02-16 DIAGNOSIS — J45909 Unspecified asthma, uncomplicated: Secondary | ICD-10-CM | POA: Insufficient documentation

## 2023-02-21 ENCOUNTER — Ambulatory Visit: Payer: BC Managed Care – PPO | Admitting: Physician Assistant

## 2023-02-26 NOTE — Progress Notes (Unsigned)
Cardiology Office Note:    Date:  02/27/2023   ID:  Jesse Norman, DOB July 10, 1971, MRN [id number]  PCP:  Janie Morning, NP (Inactive)  Cardiologist:  Norman Herrlich, MD   Referring MD: No ref. provider found  ASSESSMENT:    1. Palpitations   2. PVC (premature ventricular contraction)    PLAN:    In order of problems listed above:  The difficult issue is whether he is having arrhythmia or awareness of his heart I offered him as needed beta-blocker which we will consider Advised to try over-the-counter magnesium which can be helpful Avoid over-the-counter proarrhythmic drugs I will request his last monitor and he will see if he can access reporting the portal I encouraged him to purchase an apple smart watch to capture episodes I do not think he needs repeat cardiac imaging  Next appointment 3 months   Medication Adjustments/Labs and Tests Ordered: Current medicines are reviewed at length with the patient today.  Concerns regarding medicines are outlined above.  No orders of the defined types were placed in this encounter.  No orders of the defined types were placed in this encounter.   Chief complaint I continue to have palpitation I think I have PVCs and have seen multiple doctors over the last few decades  History of Present Illness:    Jesse Norman is a 52 y.o. male who is being seen today to establish cardiology care at the request of the patient  He was seen by [facility] cardiology and [facility] 11/14/2021 with a history of hyperlipidemia palpitation and PVCs.  The monitor is described as showing no significant arrhythmia echocardiogram with structurally normal heart.  Treadmill stress test showed an exercise tolerance well-preserved 11 METS with no arrhythmia or signs of ischemia.  He was seen by Dr. Mariah Milling for the same problem in 2013.  Struggling with this problem for 30 years intermittent palpitation at times in clusters can be very bothersome and  the word PVC has been used in the past He thinks the last time he wore a monitor to document his arrhythmia I am unable to follow-up that report in Care Everywhere he is going to try to access it in his portal and work and request the report from [facility] Outside of these episodes he has no cardiovascular symptoms edema shortness of breath chest pain palpitation or syncope He does not use over-the-counter proarrhythmic drugs 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     Allergies:   Cat hair extract, Molds & smuts, and Dust mite extract   Social History   Socioeconomic History   Marital status: Divorced    Spouse name: Not on file   Number of children: Not on file   Years of education: Not on file   Highest education level: Not on file  Occupational History   Not on file  Tobacco Use   Smoking status: Never   Smokeless tobacco: Never  Vaping Use   Vaping Use: Never used  Substance and Sexual  Activity   Alcohol use: Yes    Comment: social drinker   Drug use: No   Sexual activity: Not Currently    Partners: Female  Other Topics Concern   Not on file  Social History Narrative   Not on file   Social Determinants of Health   Financial Resource Strain: Not on file  Food Insecurity: Not on file  Transportation Needs: Not on file  Physical Activity: Not on file  Stress: Not on file  Social Connections: Not on file     Family History: The patient's family history includes Healthy in his brother and mother; Heart attack in his maternal grandfather; Hypertension in his sister; Melanoma in his  father.  ROS:   ROS Please see the history of present illness.     All other systems reviewed and are negative.  EKGs/Labs/Other Studies Reviewed:    The following studies were reviewed today:       EKG:  EKG is sinus rhythm rightward axis and normal EKG ordered today.  Recent Labs: 09/26/2022: ALT 22; BUN 12; Creatinine, Ser 1.03; Hemoglobin 13.7; Platelets 303; Potassium 4.1; Sodium 138  Recent Lipid Panel No results found for: "CHOL", "TRIG", "HDL", "CHOLHDL", "VLDL", "LDLCALC", "LDLDIRECT"  Physical Exam:    VS:  BP 120/64 (BP Location: Right Arm, Patient Position: Sitting, Cuff Size: Normal)   Pulse 66   Ht 5\' 8"  (1.727 m)   Wt 144 lb (65.3 kg)   SpO2 99%   BMI 21.90 kg/m     Wt Readings from Last 3 Encounters:  02/27/23 144 lb (65.3 kg)  09/26/22 132 lb (59.9 kg)  06/21/22 137 lb (62.1 kg)     GEN: Healthy looking man looks younger than his age pathologic well nourished, well developed in no acute distress HEENT: Normal NECK: No JVD; No carotid bruits LYMPHATICS: No lymphadenopathy CARDIAC: RRR, no murmurs, rubs, gallops RESPIRATORY:  Clear to auscultation without rales, wheezing or rhonchi  ABDOMEN: Soft, non-tender, non-distended MUSCULOSKELETAL:  No edema; No deformity  SKIN: Warm and dry NEUROLOGIC:  Alert and oriented x 3 PSYCHIATRIC:  Normal affect     Signed, Norman Herrlich, MD  02/27/2023 3:16 PM    [facility]

## 2023-02-27 ENCOUNTER — Ambulatory Visit: Payer: BC Managed Care – PPO | Attending: Cardiology | Admitting: Cardiology

## 2023-02-27 ENCOUNTER — Ambulatory Visit: Payer: BC Managed Care – PPO | Admitting: Physician Assistant

## 2023-02-27 ENCOUNTER — Encounter: Payer: Self-pay | Admitting: Cardiology

## 2023-02-27 VITALS — BP 120/64 | HR 66 | Ht 68.0 in | Wt 144.0 lb

## 2023-02-27 DIAGNOSIS — R002 Palpitations: Secondary | ICD-10-CM | POA: Diagnosis not present

## 2023-02-27 DIAGNOSIS — I493 Ventricular premature depolarization: Secondary | ICD-10-CM

## 2023-02-27 NOTE — Patient Instructions (Addendum)
Medication Instructions:  Your physician recommends that you continue on your current medications as directed. Please refer to the Current Medication list given to you today.  *If you need a refill on your cardiac medications before your next appointment, please call your pharmacy*   Lab Work: None If you have labs (blood work) drawn today and your tests are completely normal, you will receive your results only by: MyChart Message (if you have MyChart) OR A paper copy in the mail If you have any lab test that is abnormal or we need to change your treatment, we will call you to review the results.   Testing/Procedures: None   Follow-Up: At [facility], you and your health needs are our priority.  As part of our continuing mission to provide you with exceptional heart care, we have created designated Provider Care Teams.  These Care Teams include your primary Cardiologist (physician) and Advanced Practice Providers (APPs -  Physician Assistants and Nurse Practitioners) who all work together to provide you with the care you need, when you need it.  We recommend signing up for the patient portal called "MyChart".  Sign up information is provided on this After Visit Summary.  MyChart is used to connect with patients for Virtual Visits (Telemedicine).  Patients are able to view lab/test results, encounter notes, upcoming appointments, etc.  Non-urgent messages can be sent to your provider as well.   To learn more about what you can do with MyChart, go to [email or web address].    Your next appointment:   3 month(s)  Provider:   Norman Herrlich, MD    Other Instructions None     1. Avoid all over-the-counter antihistamines except Claritin/Loratadine and Zyrtec/Cetrizine. 2. Avoid all combination including cold sinus allergies flu decongestant and sleep medications 3. You can use Robitussin DM Mucinex and Mucinex DM for cough. 4. can use Tylenol aspirin ibuprofen and naproxen  but no combinations such as sleep or sinus.

## 2023-03-01 ENCOUNTER — Ambulatory Visit: Payer: BC Managed Care – PPO | Admitting: Physician Assistant

## 2023-03-08 ENCOUNTER — Ambulatory Visit (INDEPENDENT_AMBULATORY_CARE_PROVIDER_SITE_OTHER): Payer: BC Managed Care – PPO | Admitting: Physician Assistant

## 2023-03-08 ENCOUNTER — Encounter: Payer: Self-pay | Admitting: Physician Assistant

## 2023-03-08 VITALS — BP 138/70 | HR 60 | Temp 97.1°F | Resp 14 | Ht 68.0 in | Wt 144.0 lb

## 2023-03-08 DIAGNOSIS — R300 Dysuria: Secondary | ICD-10-CM | POA: Insufficient documentation

## 2023-03-08 DIAGNOSIS — Z202 Contact with and (suspected) exposure to infections with a predominantly sexual mode of transmission: Secondary | ICD-10-CM

## 2023-03-08 NOTE — Assessment & Plan Note (Signed)
a 

## 2023-03-08 NOTE — Assessment & Plan Note (Signed)
Performing urine and blood work today in office to check for potential STD/STI

## 2023-03-08 NOTE — Assessment & Plan Note (Signed)
Patient states that sometimes when urinating he will have a pain deep inside his urethra near the base of his penis. He denies any inability to void, decreased stream, stress or urge incontinence.

## 2023-03-08 NOTE — Progress Notes (Signed)
Subjective:  Patient ID: Jesse Norman, male    DOB: 1970-11-16  Age: 52 y.o. MRN: [id number]  No chief complaint on file.   HPI  Patient is here for STD testing. States he may have been exposed before but he isn't sure. He states he also hasn't been tested in a while and wants to make sure he doesn't have any that he needs to be concerned about or treated for. Denies any abnormal drainage from his penis, scrotal pain, sores or lesions. Patient denies any other major medical concerns or questions.       03/08/2023    1:36 PM 06/21/2022    3:10 PM  Depression screen PHQ 2/9  Decreased Interest 0 1  Down, Depressed, Hopeless 0 2  PHQ - 2 Score 0 3  Altered sleeping 0 0  Tired, decreased energy 0 0  Change in appetite 0 0  Feeling bad or failure about yourself  0 0  Trouble concentrating 0 0  Moving slowly or fidgety/restless 0 0  Suicidal thoughts 0 0  PHQ-9 Score 0 3  Difficult doing work/chores Not difficult at all Not difficult at all        03/08/2023    1:36 PM  Fall Risk   Falls in the past year? 0  Number falls in past yr: 0  Injury with Fall? 0  Risk for fall due to : No Fall Risks  Follow up Falls evaluation completed;Follow up appointment    Patient Care Team: Langley Gauss, [place] as PCP - General (Physician Assistant)   Review of Systems  Constitutional:  Negative for chills and fever.  HENT:  Negative for congestion, rhinorrhea and sore throat.   Respiratory:  Negative for cough and shortness of breath.   Cardiovascular:  Positive for palpitations. Negative for chest pain.  Gastrointestinal:  Positive for abdominal pain (RUQ pain). Negative for constipation, diarrhea, nausea and vomiting.  Genitourinary:  Negative for dysuria and urgency.  Musculoskeletal:  Negative for arthralgias, back pain and myalgias.  Neurological:  Negative for dizziness and headaches.  Psychiatric/Behavioral:  Negative for dysphoric mood. The patient is not nervous/anxious.     No  current outpatient medications on file prior to visit.   No current facility-administered medications on file prior to visit.   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    Palpitations    Past Surgical History:  Procedure Laterality Date   CYSTOSCOPY  2020   skin cancer removal  2018   basal cell    Family History  Problem Relation Age of Onset   Healthy Mother    Melanoma Father    Hypertension Sister    Healthy Brother    Heart attack Maternal Grandfather    Social History   Socioeconomic History   Marital status: Divorced    Spouse name: Not on file   Number of children: Not on file   Years of education: Not on file   Highest education level: Not on file  Occupational History   Not on file  Tobacco Use   Smoking status: Never   Smokeless tobacco: Never  Vaping Use   Vaping Use: Never used  Substance and Sexual Activity   Alcohol use: Yes    Comment: social drinker   Drug use: No   Sexual activity: Not Currently  Partners: Female  Other Topics Concern   Not on file  Social History Narrative   Not on file   Social Determinants of Health   Financial Resource Strain: Not on file  Food Insecurity: Not on file  Transportation Needs: Not on file  Physical Activity: Not on file  Stress: Not on file  Social Connections: Not on file    Objective:  BP 138/70   Pulse 60   Temp (!) 97.1 F (36.2 C)   Resp 14   Ht 5\' 8"  (1.727 m)   Wt 144 lb (65.3 kg)   BMI 21.90 kg/m      03/08/2023    1:32 PM 02/27/2023    2:53 PM 09/26/2022    1:00 PM  BP/Weight  Systolic BP 138 120 141  Diastolic BP 70 64 73  Wt. (Lbs) 144 144   BMI 21.9 kg/m2 21.9 kg/m2     Physical Exam Vitals reviewed.  Constitutional:      Appearance: Normal appearance.  Cardiovascular:     Rate and Rhythm: Normal rate and regular rhythm.     Heart sounds: Normal heart sounds.   Pulmonary:     Effort: Pulmonary effort is normal.     Breath sounds: Normal breath sounds.  Abdominal:     General: Bowel sounds are normal.     Palpations: Abdomen is soft.     Tenderness: There is no abdominal tenderness.  Neurological:     Mental Status: He is alert and oriented to person, place, and time.  Psychiatric:        Mood and Affect: Mood normal.        Behavior: Behavior normal.     Diabetic Foot Exam - Simple   No data filed      Lab Results  Component Value Date   WBC 5.9 09/26/2022   HGB 13.7 09/26/2022   HCT 41.7 09/26/2022   PLT 303 09/26/2022   GLUCOSE 107 (H) 09/26/2022   ALT 22 09/26/2022   AST 25 09/26/2022   NA 138 09/26/2022   K 4.1 09/26/2022   CL 103 09/26/2022   CREATININE 1.03 09/26/2022   BUN 12 09/26/2022   CO2 28 09/26/2022      Assessment & Plan:    Potential exposure to STD Assessment & Plan: Performing urine and blood work today in office to check for potential STD/STI  Orders: -     GC/Chlamydia Probe Amp -     RPR -     HIV Antibody (routine testing w rflx) -     Hepatitis C antibody -     Hepatitis B surface antibody,quantitative  Dysuria Assessment & Plan: Patient states that sometimes when urinating he will have a pain deep inside his urethra near the base of his penis. He denies any inability to void, decreased stream, stress or urge incontinence.   Orders: -     PSA     No orders of the defined types were placed in this encounter.   Orders Placed This Encounter  Procedures   GC/Chlamydia Probe Amp   RPR   HIV Antibody (routine testing w rflx)   Hepatitis C antibody   Hepatitis B surface antibody,quantitative   PSA     Follow-up: Return if symptoms worsen or fail to improve.   I,Carolyn M Morrison,acting as a Neurosurgeon for [employer], [employer].,have documented all relevant documentation on the behalf of Langley Gauss, PA,as directed by  Langley Gauss, PA while in the presence of  Langley Gauss, [place].   An After  Visit Summary was printed and given to the patient.  Langley Gauss, [place] [facility] [phone]

## 2023-03-09 LAB — RPR: RPR Ser Ql: NONREACTIVE

## 2023-03-09 LAB — HEPATITIS C ANTIBODY: Hep C Virus Ab: NONREACTIVE

## 2023-03-09 LAB — HEPATITIS B SURFACE ANTIBODY, QUANTITATIVE: Hepatitis B Surf Ab Quant: <3.5 m[IU]/mL — ABNORMAL LOW (ref >9.9)

## 2023-03-09 LAB — PSA: Prostate Specific Ag, Serum: 0.5 ng/mL (ref 0.0–4.0)

## 2023-03-09 LAB — HIV ANTIBODY (ROUTINE TESTING W REFLEX): HIV Screen 4th Generation wRfx: NONREACTIVE

## 2023-03-11 LAB — GC/CHLAMYDIA PROBE AMP
Chlamydia trachomatis, NAA: NEGATIVE
Neisseria Gonorrhoeae by PCR: NEGATIVE

## 2023-06-06 NOTE — Progress Notes (Deleted)
  Cardiology Office Note:    Date:  06/06/2023   ID:  Jesse Norman, DOB August 15, 1971, MRN [id number]  PCP:  Langley Gauss, PA  Cardiologist:  Norman Herrlich, MD    Referring MD: No ref. provider found    ASSESSMENT:    No diagnosis found. PLAN:    In order of problems listed above:  ***   Next appointment: ***   Medication Adjustments/Labs and Tests Ordered: Current medicines are reviewed at length with the patient today.  Concerns regarding medicines are outlined above.  No orders of the defined types were placed in this encounter.  No orders of the defined types were placed in this encounter.    History of Present Illness:    Jesse Norman is a 52 y.o. male with a hx of palpitation and symptomatic PVCs last seen 02/27/2023.  He opted to use a smart watch to monitor his heart rhythm. 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      EKGs/Labs/Other Studies Reviewed:    The following studies were reviewed today:  Cardiac Studies & Procedures         MONITORS  LONG TERM MONITOR (3-14 DAYS) 03/12/2022               Recent Labs: 09/26/2022: ALT 22; BUN 12; Creatinine, Ser 1.03; Hemoglobin 13.7; Platelets 303; Potassium 4.1; Sodium 138  Recent Lipid Panel No results found for: "CHOL", "TRIG", "HDL", "CHOLHDL", "VLDL", "LDLCALC", "LDLDIRECT"  Physical Exam:    VS:  There were no vitals taken for this visit.    Wt Readings from Last 3 Encounters:  03/08/23 144 lb (65.3 kg)  02/27/23 144 lb (65.3 kg)  09/26/22 132 lb (59.9 kg)     GEN: *** Well nourished, well developed in no acute distress HEENT: Normal NECK: No JVD; No carotid  bruits LYMPHATICS: No lymphadenopathy CARDIAC: ***RRR, no murmurs, rubs, gallops RESPIRATORY:  Clear to auscultation without rales, wheezing or rhonchi  ABDOMEN: Soft, non-tender, non-distended MUSCULOSKELETAL:  No edema; No deformity  SKIN: Warm and dry NEUROLOGIC:  Alert and oriented x 3 PSYCHIATRIC:  Normal affect    Signed, Norman Herrlich, MD  06/06/2023 9:17 PM    [facility]

## 2023-06-11 ENCOUNTER — Ambulatory Visit: Payer: BC Managed Care – PPO | Admitting: Cardiology

## 2023-06-28 ENCOUNTER — Ambulatory Visit: Payer: BC Managed Care – PPO | Attending: Cardiology | Admitting: Cardiology

## 2023-06-28 ENCOUNTER — Ambulatory Visit: Payer: BC Managed Care – PPO

## 2023-06-28 VITALS — BP 128/70 | HR 65 | Ht 68.0 in | Wt 142.4 lb

## 2023-06-28 DIAGNOSIS — R002 Palpitations: Secondary | ICD-10-CM

## 2023-06-28 DIAGNOSIS — I493 Ventricular premature depolarization: Secondary | ICD-10-CM

## 2023-06-28 NOTE — Patient Instructions (Addendum)
Medication Instructions:  Your physician recommends that you continue on your current medications as directed. Please refer to the Current Medication list given to you today.  *If you need a refill on your cardiac medications before your next appointment, please call your pharmacy*   Lab Work: NONE If you have labs (blood work) drawn today and your tests are completely normal, you will receive your results only by: MyChart Message (if you have MyChart) OR A paper copy in the mail If you have any lab test that is abnormal or we need to change your treatment, we will call you to review the results.   Testing/Procedures: You have been asked to wear a Zio Heart Monitor today. It is to be worn for 7 days. Please remove the monitor on 07/05/23 and mail back in the box provided.  If you have any questions about the monitor please call the company at [phone]     Follow-Up: At [facility], you and your health needs are our priority.  As part of our continuing mission to provide you with exceptional heart care, we have created designated Provider Care Teams.  These Care Teams include your primary Cardiologist (physician) and Advanced Practice Providers (APPs -  Physician Assistants and Nurse Practitioners) who all work together to provide you with the care you need, when you need it.  We recommend signing up for the patient portal called "MyChart".  Sign up information is provided on this After Visit Summary.  MyChart is used to connect with patients for Virtual Visits (Telemedicine).  Patients are able to view lab/test results, encounter notes, upcoming appointments, etc.  Non-urgent messages can be sent to your provider as well.   To learn more about what you can do with MyChart, go to [email or web address].    Your next appointment:   3 month(s)  Provider:   Norman Herrlich, MD    Other Instructions  1. Avoid all over-the-counter antihistamines except Claritin/Loratadine and  Zyrtec/Cetrizine. 2. Avoid all combination including cold sinus allergies flu decongestant and sleep medications 3. You can use Robitussin DM Mucinex and Mucinex DM for cough. 4. can use Tylenol aspirin ibuprofen and naproxen but no combinations such as sleep or sinus.   KardiaMobile [email or web address]        FDA-cleared, clinical grade mobile EKG monitor: Lourena Simmonds is the most clinically-validated mobile EKG used by the world's leading cardiac care medical professionals With Basic service, know instantly if your heart rhythm is normal or if atrial fibrillation is detected, and email the last single EKG recording to yourself or your doctor Premium service, available for purchase through the Kardia app for $9.99 per month or $99 per year, includes unlimited history and storage of your EKG recordings, a monthly EKG summary report to share with your doctor, along with the ability to track your blood pressure, activity and weight Includes one KardiaMobile phone clip FREE SHIPPING: Standard delivery 1-3 business days. Orders placed by 11:00am PST will ship that afternoon. Otherwise, will ship next business day. All orders ship via [employer] from [place], [place]

## 2023-06-28 NOTE — Progress Notes (Signed)
Cardiology Office Note:    Date:  06/28/2023   ID:  Jesse Norman, DOB November 05, 1970, MRN [id number]  PCP:  Langley Gauss, PA  Cardiologist:  Norman Herrlich, MD    Referring MD: Langley Gauss, [place]    ASSESSMENT:    1. Palpitations   2. PVC (premature ventricular contraction)    PLAN:    In order of problems listed above:  1 weeks ZIO monitor assess decide whether he needs suppressant therapy for arrhythmia I advised him to get a smart watch or mobile Kardia for self-monitoring   Next appointment: 3 months   Medication Adjustments/Labs and Tests Ordered: Current medicines are reviewed at length with the patient today.  Concerns regarding medicines are outlined above.  Orders Placed This Encounter  Procedures   EKG 12-Lead   No orders of the defined types were placed in this encounter.    History of Present Illness:    Jesse Norman is a 52 y.o. male with a hx of palpitation and symptomatic PVCs last seen 02/27/2023.  He opted to use a smart watch to monitor his heart rhythm.  EKG Interpretation Date/Time:  Thursday June 28 2023 16:25:32 EDT Ventricular Rate:  65 PR Interval:  174 QRS Duration:  96 QT Interval:  406 QTC Calculation: 422 R Axis:   89  Text Interpretation: Normal sinus rhythm Incomplete right bundle branch block When compared with ECG of 15-Jul-2018 12:33, Incomplete right bundle branch block is now Present Confirmed by Norman Herrlich ([id number]) on 06/28/2023 4:40:20 PM    Compliance with diet, lifestyle and medications: Yes  He has recently had a flare of palpitations sounds very much like PVCs and tends to come and go cluster and is made a very uncertain and concerned Asked me to review the monitor that he had in [place] in 2023 and it really did not show any arrhythmia and his episodes were sinus rhythm sinus bradycardia I again discussed that he would benefit from a smart watch sure that he mobile Lourena Simmonds is a good self monitor With a flare of  symptoms organ apply a ZIO monitor and if he has frequent PVCs that advised him to take a beta-blocker maintenance or at least as needed He takes no over-the-counter proarrhythmic drugs He seems much more reassured 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      EKGs/Labs/Other Studies Reviewed:    The following studies were reviewed today:  Cardiac Studies & Procedures         MONITORS  LONG TERM MONITOR (3-14 DAYS) 03/12/2022               Recent Labs: 09/26/2022: ALT 22; BUN 12; Creatinine, Ser 1.03; Hemoglobin 13.7; Platelets 303; Potassium 4.1; Sodium 138  Recent Lipid Panel No results found for: "CHOL", "TRIG", "HDL", "CHOLHDL", "VLDL", "LDLCALC", "LDLDIRECT"  Physical Exam:    VS:  BP 128/70 (BP Location: Right Arm, Patient Position: Sitting, Cuff Size: Normal)   Pulse 65   Ht 5\' 8"  (1.727 m)   Wt 142 lb 6.4 oz (64.6 kg)   SpO2 98%   BMI 21.65 kg/m  Wt Readings from Last 3 Encounters:  06/28/23 142 lb 6.4 oz (64.6 kg)  03/08/23 144 lb (65.3 kg)  02/27/23 144 lb (65.3 kg)     GEN:  Well nourished, well developed in no acute distress HEENT: Normal NECK: No JVD; No carotid bruits LYMPHATICS: No lymphadenopathy CARDIAC: RRR, no murmurs, rubs, gallops RESPIRATORY:  Clear to auscultation without rales, wheezing or rhonchi  ABDOMEN: Soft, non-tender, non-distended MUSCULOSKELETAL:  No edema; No deformity  SKIN: Warm and dry NEUROLOGIC:  Alert and oriented x 3 PSYCHIATRIC:  Normal affect    Signed, Norman Herrlich, MD  06/28/2023 4:39 PM    [facility]

## 2023-07-22 ENCOUNTER — Encounter: Payer: Self-pay | Admitting: Cardiology

## 2023-07-24 ENCOUNTER — Ambulatory Visit: Payer: BC Managed Care – PPO | Admitting: Cardiology

## 2023-11-10 ENCOUNTER — Ambulatory Visit (HOSPITAL_BASED_OUTPATIENT_CLINIC_OR_DEPARTMENT_OTHER): Payer: Self-pay

## 2024-01-16 ENCOUNTER — Ambulatory Visit (HOSPITAL_BASED_OUTPATIENT_CLINIC_OR_DEPARTMENT_OTHER)

## 2024-01-16 ENCOUNTER — Ambulatory Visit (HOSPITAL_BASED_OUTPATIENT_CLINIC_OR_DEPARTMENT_OTHER): Payer: Self-pay

## 2024-04-28 ENCOUNTER — Ambulatory Visit: Payer: Self-pay

## 2024-04-28 NOTE — Telephone Encounter (Signed)
 FYI Only or Action Required?: FYI only for provider.  Patient was last seen in primary care on 03/08/2023 by Milon Cleaves, PA.  Called Nurse Triage reporting Abdominal Pain.  Symptoms began several years ago.  Interventions attempted: Rest, hydration, or home remedies.  Symptoms are: gradually worsening.  Triage Disposition: See Physician Within 24 Hours  Patient/caregiver understands and will follow disposition?: Yes  Copied from CRM #[id number]. Topic: Clinical - Red Word Triage >> Apr 28, 2024  2:50 PM Rosaria BRAVO wrote: Red Word that prompted transfer to Nurse Triage: Abdominal pain, upset stomach, nausea.    ----------------------------------------------------------------------- From previous Reason for Contact - Scheduling: Patient/patient representative is calling to schedule an appointment. Refer to attachments for appointment information. Reason for Disposition  [1] MODERATE pain (e.g., interferes with normal activities) AND [2] pain comes and goes (cramps) AND [3] present > 24 hours  (Exception: Pain with Vomiting or Diarrhea - see that Guideline.)  Answer Assessment - Initial Assessment Questions 1. LOCATION: Where does it hurt?      Right side but varies 2. RADIATION: Does the pain shoot anywhere else? (e.g., chest, back)     No  3. ONSET: When did the pain begin? (Minutes, hours or days ago)      3 years ago but worsening 4. SUDDEN: Gradual or sudden onset?     Gradual 5. PATTERN Does the pain come and go, or is it constant?     intermittent 6. SEVERITY: How bad is the pain?  (e.g., Scale 1-10; mild, moderate, or severe)     5/10 7. RECURRENT SYMPTOM: Have you ever had this type of stomach pain before? If Yes, ask: When was the last time? and What happened that time?      Yes 8. CAUSE: What do you think is causing the stomach pain? (e.g., gallstones, recent abdominal surgery)     Unsure, had gallbladder 9. RELIEVING/AGGRAVATING FACTORS: What  makes it better or worse? (e.g., antacids, bending or twisting motion, bowel movement)     Sometimes a stretch will help for a short time.  10. OTHER SYMPTOMS: Do you have any other symptoms? (e.g., back pain, diarrhea, fever, urination pain, vomiting)       Denies other symptoms  Additional info: Patient states he has been dealing with right sided abdominal pain and nausea for approximately 3 years, calling today to schedule with pcp for follow up as pain is worsening, denies new symptoms states pain is more consistently elevated.  Protocols used: Abdominal Pain - Male-A-AH

## 2024-04-28 NOTE — Progress Notes (Signed)
 Acute Office Visit  Subjective:    Patient ID: Jesse Norman, male    DOB: 1971-07-20, 53 y.o.   MRN: [id number]  Chief Complaint  Patient presents with   Abdominal Pain    Right upper and lower quadrant    HPI: Patient is in today for pancreatitis, constipation, ulcer.  Discussed the use of AI scribe software for clinical note transcription with the patient, who gave verbal consent to proceed.  History of Present Illness   Jesse Norman is a 53 year old male who presents with recurrent abdominal pain and gastrointestinal symptoms following a cholecystectomy.  He has experienced various types of abdominal pain since his cholecystectomy, describing it as muscle-like, deep, or radiating sickness pain under the ribs. These symptoms have persisted for about three years, with some worsening post-surgery. The pain sometimes resembles indigestion, not affecting bowel movements, and is often triggered by sugary foods, especially before bed, causing early morning awakenings. Sitting up alleviates the pain, and avoiding late-night eating reduces its occurrence, though recently, the pain occurs randomly throughout the day.  He has undergone numerous imaging studies and tests over the past three years, including an endoscopy about a year ago, which did not reveal any ulcers. He has also visited the emergency room post-surgery due to chest pains, which were attributed to gas from the surgery.  He has tried various medications, including Pepcid, omeprazole, and pantoprazole , without relief. Cipro provided some nausea relief. Currently, he takes Elmiron twice daily, with unclear efficacy, and uses Pepto Bismol occasionally, which helps after eating.  He reports irregular bowel movements, typically every few days, with occasional diarrhea. No blood or mucus in stools, but recent thin stools and incomplete bowel emptying sensation. He has a history of kidney stones but denies recent urinary symptoms like  blood in urine or burning during urination. No recent fevers or frequent use of pain medications like ibuprofen or Tylenol .  He mentions occasional alcohol use but not in large quantities. He has been cautious with his diet post-surgery, avoiding certain foods, but has not identified specific food intolerances. He experiences dizziness upon standing, occurring for a year or two, but believes he stays hydrated.        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    Palpitations    PVC's (premature ventricular contractions) 10/12/2021    Past Surgical History:  Procedure Laterality Date   CYSTOSCOPY  2020   skin cancer removal  2018   basal cell    Family History  Problem Relation Age of Onset   Healthy Mother    Melanoma Father    Hypertension Sister    Healthy Brother    Heart attack Maternal Grandfather     Social History   Socioeconomic History   Marital status: Divorced    Spouse name: Not on file   Number of children: Not on file   Years of education: Not on file   Highest education level: Not on file  Occupational History   Not on file  Tobacco Use   Smoking status: Never   Smokeless tobacco: Never  Vaping Use   Vaping status: Never Used  Substance and Sexual Activity   Alcohol use: Yes    Comment: social drinker   Drug use: No   Sexual activity: Not Currently    Partners:  Female  Other Topics Concern   Not on file  Social History Narrative   Not on file   Social Drivers of Health   Financial Resource Strain: Patient Declined (01/10/2024)   Received from [facility]   Overall Financial Resource Strain (CARDIA)    Difficulty of Paying Living Expenses: Patient declined  Food Insecurity: Patient Declined (01/10/2024)   Received from [facility]   Hunger Vital Sign    Within the past 12 months, you worried that your food would run out before you got  the money to buy more.: Patient declined    Within the past 12 months, the food you bought just didn't last and you didn't have money to get more.: Patient declined  Transportation Needs: Patient Declined (01/10/2024)   Received from [facility] - Transportation    Lack of Transportation (Medical): Patient declined    Lack of Transportation (Non-Medical): Patient declined  Physical Activity: Not on file  Stress: No Stress Concern Present (01/15/2024)   Received from [facility] - Occupational Stress Questionnaire    Feeling of Stress : Not at all  Social Connections: Unknown (08/22/2022)   Received from [facility]   Social Network    Social Network: Not on file  Intimate Partner Violence: Unknown (08/22/2022)   Received from [facility]   HITS    Physically Hurt: Not on file    Insult or Talk Down To: Not on file    Threaten Physical Harm: Not on file    Scream or Curse: Not on file    No outpatient medications prior to visit.   No facility-administered medications prior to visit.    Allergies  Allergen Reactions   Cat Dander Itching    Itchy nose, eyes, throat, wheezing   Molds & Smuts Itching    Asthma symptoms, wheezing   Dust Mite Extract     Review of Systems  Constitutional:  Negative for appetite change, fatigue and fever.  HENT:  Negative for congestion, ear pain, sinus pressure and sore throat.   Respiratory:  Negative for cough, chest tightness, shortness of breath and wheezing.   Cardiovascular:  Negative for chest pain and palpitations.  Gastrointestinal:  Positive for abdominal pain (Right upper and lower quadrant), constipation and nausea. Negative for diarrhea and vomiting.  Genitourinary:  Negative for dysuria and hematuria.  Musculoskeletal:  Negative for arthralgias, back pain, joint swelling and myalgias.  Skin:  Negative for rash.  Neurological:  Negative for dizziness, weakness and  headaches.  Psychiatric/Behavioral:  Negative for dysphoric mood. The patient is not nervous/anxious.        Objective:        04/29/2024    2:47 PM 06/28/2023    4:19 PM 03/08/2023    1:32 PM  Vitals with BMI  Height 5' 8 5' 8 5' 8  Weight 131 lbs 142 lbs 6 oz 144 lbs  BMI 19.92 21.66 21.9  Systolic 138 128 861  Diastolic 82 70 70  Pulse 56 65 60    No data found.    Physical Exam Vitals reviewed.  Constitutional:      Appearance: Normal appearance.  Neck:     Vascular: No carotid bruit.  Cardiovascular:     Rate and Rhythm: Normal rate and regular rhythm.     Heart sounds: Normal heart sounds.  Pulmonary:     Effort: Pulmonary effort is normal.     Breath sounds: Normal  breath sounds.  Abdominal:     General: Bowel sounds are normal.     Palpations: Abdomen is soft.     Tenderness: There is abdominal tenderness in the right upper quadrant. There is no right CVA tenderness, left CVA tenderness, guarding or rebound.  Neurological:     Mental Status: He is alert and oriented to person, place, and time.  Psychiatric:        Mood and Affect: Mood normal.        Behavior: Behavior normal.     Health Maintenance Due  Topic Date Due   Pneumococcal Vaccine: 19-49 Years (1 of 2 - PCV) Never done   Pneumococcal Vaccine: 50+ Years (1 of 2 - PCV) Never done   Hepatitis B Vaccines (1 of 3 - 19+ 3-dose series) Never done   Zoster Vaccines- Shingrix (1 of 2) Never done   DTaP/Tdap/Td (1 - Tdap) 01/21/2018   COVID-19 Vaccine (4 - 2024-25 season) 05/27/2023   INFLUENZA VACCINE  04/25/2024       Topic Date Due   Hepatitis B Vaccines (1 of 3 - 19+ 3-dose series) Never done     No results found for: TSH Lab Results  Component Value Date   WBC 5.5 04/29/2024   HGB 13.6 04/29/2024   HCT 41.5 04/29/2024   MCV 93 04/29/2024   PLT 275 04/29/2024   Lab Results  Component Value Date   NA 137 04/29/2024   K 4.7 04/29/2024   CO2 23 04/29/2024   GLUCOSE 90  04/29/2024   BUN 13 04/29/2024   CREATININE 1.12 04/29/2024   BILITOT 0.6 04/29/2024   ALKPHOS 58 04/29/2024   AST 28 04/29/2024   ALT 28 04/29/2024   PROT 7.1 04/29/2024   ALBUMIN 4.7 04/29/2024   CALCIUM 10.0 04/29/2024   ANIONGAP 7 09/26/2022   EGFR 78 04/29/2024   No results found for: CHOL No results found for: HDL No results found for: LDLCALC No results found for: TRIG No results found for: CHOLHDL Lab Results  Component Value Date   HGBA1C 5.3 04/29/2024  Total time spent on today's visit was 40 minutes, including both face-to-face time and nonface-to-face time personally spent on review of chart (labs and imaging), discussing labs and goals, discussing further work-up, treatment options, referrals to specialist if needed, reviewing outside records of pertinent, answering patient's questions, and coordinating care.      Assessment & Plan:  RUQ abdominal pain Assessment & Plan: Chronic abdominal pain and constipation post-cholecystectomy. Pain varies, sometimes linked to sugary foods. Differential includes constipation, pancreatitis, partial bowel obstruction. Previous imaging and endoscopy negative. Pancreatitis considered due to pain relief when sitting up. Discussed dietary influences. - Order abdominal x-ray for constipation or obstruction. - Order blood work: amylase, lipase, CBC, kidney, liver function tests. - Prescribe Miralax twice daily. - Provide Linzess sample if Miralax and dietary changes insufficient. - Advise increased fiber intake and hydration. - Discuss potential Nexium use; deferred by patient. - Advise heating pads and Advil as needed.  Orders: -     DG Abd 2 Views; Future -     CBC with Differential/Platelet -     Comprehensive metabolic panel with GFR -     Amylase -     Lipase -     Hemoglobin A1c  PVC's (premature ventricular contractions) Assessment & Plan: Intermittent orthostatic dizziness. Slightly elevated blood pressure  noted, likely pain-related. - Advise adequate hydration, especially when working outside.       No  orders of the defined types were placed in this encounter.   Orders Placed This Encounter  Procedures   DG Abd 2 Views   CBC with Differential/Platelet   Comprehensive metabolic panel with GFR   Amylase   Lipase   Hemoglobin A1c     Follow-up: No follow-ups on file.  An After Visit Summary was printed and given to the patient.    I,Lauren M Auman,acting as a Neurosurgeon for [employer], [employer].,have documented all relevant documentation on the behalf of Nola Angles, PA,as directed by  Nola Angles, PA while in the presence of Nola Angles, [place].    Nola Angles, [place] [facility] [phone]

## 2024-04-29 ENCOUNTER — Ambulatory Visit (INDEPENDENT_AMBULATORY_CARE_PROVIDER_SITE_OTHER): Admitting: Physician Assistant

## 2024-04-29 ENCOUNTER — Encounter: Payer: Self-pay | Admitting: Physician Assistant

## 2024-04-29 VITALS — BP 138/82 | HR 56 | Temp 97.4°F | Ht 68.0 in | Wt 131.0 lb

## 2024-04-29 DIAGNOSIS — I493 Ventricular premature depolarization: Secondary | ICD-10-CM | POA: Diagnosis not present

## 2024-04-29 DIAGNOSIS — R1011 Right upper quadrant pain: Secondary | ICD-10-CM | POA: Diagnosis not present

## 2024-04-29 LAB — HEMOGLOBIN A1C
Est. average glucose Bld gHb Est-mCnc: 105 mg/dL
Hgb A1c MFr Bld: 5.3 % (ref 4.8–5.6)

## 2024-04-29 NOTE — Patient Instructions (Signed)
 VISIT SUMMARY:  You visited today to discuss your ongoing abdominal pain and gastrointestinal symptoms that have persisted since your cholecystectomy three years ago. We reviewed your history, including various tests and treatments you've tried, and discussed your current symptoms and potential triggers.  YOUR PLAN:  -CHRONIC INTERMITTENT ABDOMINAL PAIN AND CONSTIPATION: This refers to ongoing abdominal pain and irregular bowel movements that have been occurring since your gallbladder removal. We will order an abdominal x-ray and blood tests to check for any underlying issues like constipation or pancreatitis. You are prescribed Miralax to take twice daily to help with bowel movements. If this and dietary changes are not enough, we provided a sample of Linzess. Increase your fiber intake and stay well-hydrated. You can use heating pads and take Advil as needed for pain relief.  -STATUS POST CHOLECYSTECTOMY: This means you are experiencing symptoms following your gallbladder removal surgery. The current pain might be related to constipation or pancreatitis, which we are investigating.  -ORTHOSTATIC DIZZINESS: This is dizziness that occurs when you stand up, which can be related to changes in blood pressure. Ensure you stay well-hydrated, especially when working outside.  INSTRUCTIONS:  Please follow up after completing the abdominal x-ray and blood tests. Continue with the prescribed medications and dietary recommendations. If your symptoms persist or worsen, contact our office for further evaluation.

## 2024-04-30 LAB — CBC WITH DIFFERENTIAL/PLATELET
Basophils Absolute: 0 x10E3/uL (ref 0.0–0.2)
Basos: 1 %
EOS (ABSOLUTE): 0 x10E3/uL (ref 0.0–0.4)
Eos: 1 %
Hematocrit: 41.5 % (ref 37.5–51.0)
Hemoglobin: 13.6 g/dL (ref 13.0–17.7)
Immature Grans (Abs): 0 x10E3/uL (ref 0.0–0.1)
Immature Granulocytes: 0 %
Lymphocytes Absolute: 1.5 x10E3/uL (ref 0.7–3.1)
Lymphs: 28 %
MCH: 30.5 pg (ref 26.6–33.0)
MCHC: 32.8 g/dL (ref 31.5–35.7)
MCV: 93 fL (ref 79–97)
Monocytes Absolute: 0.6 x10E3/uL (ref 0.1–0.9)
Monocytes: 12 %
Neutrophils Absolute: 3.2 x10E3/uL (ref 1.4–7.0)
Neutrophils: 58 %
Platelets: 275 x10E3/uL (ref 150–450)
RBC: 4.46 x10E6/uL (ref 4.14–5.80)
RDW: 11.6 % (ref 11.6–15.4)
WBC: 5.5 x10E3/uL (ref 3.4–10.8)

## 2024-04-30 LAB — AMYLASE: Amylase: 73 U/L (ref 31–110)

## 2024-04-30 LAB — COMPREHENSIVE METABOLIC PANEL WITH GFR
ALT: 28 IU/L (ref 0–44)
AST: 28 IU/L (ref 0–40)
Albumin: 4.7 g/dL (ref 3.8–4.9)
Alkaline Phosphatase: 58 IU/L (ref 44–121)
BUN/Creatinine Ratio: 12 (ref 9–20)
BUN: 13 mg/dL (ref 6–24)
Bilirubin Total: 0.6 mg/dL (ref 0.0–1.2)
CO2: 23 mmol/L (ref 20–29)
Calcium: 10 mg/dL (ref 8.7–10.2)
Chloride: 99 mmol/L (ref 96–106)
Creatinine, Ser: 1.12 mg/dL (ref 0.76–1.27)
Globulin, Total: 2.4 g/dL (ref 1.5–4.5)
Glucose: 90 mg/dL (ref 70–99)
Potassium: 4.7 mmol/L (ref 3.5–5.2)
Sodium: 137 mmol/L (ref 134–144)
Total Protein: 7.1 g/dL (ref 6.0–8.5)
eGFR: 78 mL/min/1.73 (ref >59)

## 2024-04-30 LAB — LIPASE: Lipase: 30 U/L (ref 13–78)

## 2024-05-05 ENCOUNTER — Ambulatory Visit: Payer: Self-pay | Admitting: Physician Assistant

## 2024-05-07 DIAGNOSIS — R1011 Right upper quadrant pain: Secondary | ICD-10-CM | POA: Insufficient documentation

## 2024-05-07 NOTE — Assessment & Plan Note (Signed)
 Chronic abdominal pain and constipation post-cholecystectomy. Pain varies, sometimes linked to sugary foods. Differential includes constipation, pancreatitis, partial bowel obstruction. Previous imaging and endoscopy negative. Pancreatitis considered due to pain relief when sitting up. Discussed dietary influences. - Order abdominal x-ray for constipation or obstruction. - Order blood work: amylase, lipase, CBC, kidney, liver function tests. - Prescribe Miralax twice daily. - Provide Linzess sample if Miralax and dietary changes insufficient. - Advise increased fiber intake and hydration. - Discuss potential Nexium use; deferred by patient. - Advise heating pads and Advil as needed.

## 2024-05-07 NOTE — Assessment & Plan Note (Signed)
 Intermittent orthostatic dizziness. Slightly elevated blood pressure noted, likely pain-related. - Advise adequate hydration, especially when working outside.

## 2024-06-20 ENCOUNTER — Ambulatory Visit (HOSPITAL_BASED_OUTPATIENT_CLINIC_OR_DEPARTMENT_OTHER): Payer: Self-pay

## 2024-06-21 ENCOUNTER — Other Ambulatory Visit: Payer: Self-pay

## 2024-06-21 ENCOUNTER — Encounter (HOSPITAL_BASED_OUTPATIENT_CLINIC_OR_DEPARTMENT_OTHER): Payer: Self-pay

## 2024-06-21 ENCOUNTER — Ambulatory Visit (HOSPITAL_BASED_OUTPATIENT_CLINIC_OR_DEPARTMENT_OTHER): Admission: RE | Admit: 2024-06-21 | Discharge: 2024-06-21 | Disposition: A | Discharge status: Home / Self Care

## 2024-06-21 VITALS — BP 145/85 | HR 81 | Temp 98.0°F | Resp 18

## 2024-06-21 DIAGNOSIS — S6991XA Unspecified injury of right wrist, hand and finger(s), initial encounter: Secondary | ICD-10-CM | POA: Diagnosis not present

## 2024-06-21 NOTE — ED Triage Notes (Signed)
 Pt is here with aR hand middle finger prink from a sharp leave, pt denies pain today.

## 2024-06-21 NOTE — Discharge Instructions (Signed)
 No concerns on exam. Keep area clean and follow up as needed.

## 2024-06-21 NOTE — ED Provider Notes (Signed)
 Jesse Norman    CSN: [id number] Arrival date & time: 06/21/24  0856      History   Chief Complaint Chief Complaint  Patient presents with   Prick to finger    HPI Jesse Norman is a 53 y.o. male.   Patient is a 53 year old male who presents today with right middle finger wound.  Reports that yesterday he was picking up some dog poop and at the same time a pointy leaf stuck his middle finger. He had gloves on.  Was concerned due to his dog having Giardia.  He did clean the wound well after that happened.  He is otherwise feeling fine having no problems.     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    Palpitations    PVC's (premature ventricular contractions) 10/12/2021    Patient Active Problem List   Diagnosis Date Noted   RUQ abdominal pain 05/07/2024   Potential exposure to STD 03/08/2023   Dysuria 03/08/2023   Palpitations 02/16/2023   Asthma 02/16/2023   Anxiety and depression 02/16/2023   PVC's (premature ventricular contractions) 10/12/2021   Cancer of skin 2018   Chest pain 08/05/2012    Past Surgical History:  Procedure Laterality Date   CYSTOSCOPY  2020   skin cancer removal  2018   basal cell       Home Medications    Prior to Admission medications   Medication Sig Start Date End Date Taking? Authorizing Provider  dicyclomine (BENTYL) 10 MG capsule SMARTSIG:2 Capsule(s) By Mouth Every 8 Hours PRN 12/31/23  Yes [provider]  ibuprofen (ADVIL) 200 MG tablet Take 400 mg by mouth. 01/15/24  Yes [provider]  oxyCODONE  (OXY IR/ROXICODONE ) 5 MG immediate release tablet Take 5 mg by mouth every 6 (six) hours as needed. 01/15/24  Yes [provider]    Family History Family History  Problem Relation Age of Onset   Healthy Mother    Melanoma Father    Hypertension Sister    Healthy Brother    Heart attack  Maternal Grandfather     Social History Social History   Tobacco Use   Smoking status: Never   Smokeless tobacco: Never  Vaping Use   Vaping status: Never Used  Substance Use Topics   Alcohol use: Yes    Comment: social drinker   Drug use: No     Allergies   Cat dander, Molds & smuts, and Dust mite extract   Review of Systems Review of Systems  See HPI Physical Exam Triage Vital Signs ED Triage Vitals  Encounter Vitals Group     BP 06/21/24 0913 (!) 145/85     Girls Systolic BP Percentile --      Girls Diastolic BP Percentile --      Boys Systolic BP Percentile --      Boys Diastolic BP Percentile --      Pulse Rate 06/21/24 0913 81     Resp 06/21/24 0913 18     Temp 06/21/24 0913 98 F (36.7 C)     Temp Source 06/21/24 0913 Oral     SpO2 06/21/24 0913 98 %     Weight --      Height --      Head Circumference --      Peak Flow --  Pain Score 06/21/24 0912 0     Pain Loc --      Pain Education --      Exclude from Growth Chart --    No data found.  Updated Vital Signs BP (!) 145/85 (BP Location: Right Arm)   Pulse 81   Temp 98 F (36.7 C) (Oral)   Resp 18   SpO2 98%   Visual Acuity Right Eye Distance:   Left Eye Distance:   Bilateral Distance:    Right Eye Near:   Left Eye Near:    Bilateral Near:     Physical Exam Constitutional:      Appearance: Normal appearance.  Pulmonary:     Effort: Pulmonary effort is normal.  Musculoskeletal:        General: Normal range of motion.  Skin:    General: Skin is warm and dry.     Comments: Very tiny puncture to tip of the right middle finger without any concerns of infection.   Neurological:     Mental Status: He is alert.  Psychiatric:        Mood and Affect: Mood normal.      UC Treatments / Results  Labs (all labs ordered are listed, but only abnormal results are displayed) Labs Reviewed - No data to display  EKG   Radiology No results found.  Procedures Procedures  (including critical Norman time)  Medications Ordered in UC Medications - No data to display  Initial Impression / Assessment and Plan / UC Course  I have reviewed the triage vital signs and the nursing notes.  Pertinent labs & imaging results that were available during my Norman of the patient were reviewed by me and considered in my medical decision making (see chart for details).     Right finger injury-no concerns on exam.  Recommend continue to monitor for signs of infection and follow-up as needed. Final Clinical Impressions(s) / UC Diagnoses   Final diagnoses:  Injury of finger of right hand, initial encounter     Discharge Instructions      No concerns on exam. Keep area clean and follow up as needed.     ED Prescriptions   None    PDMP not reviewed this encounter.   Adah Wilbert LABOR, FNP 06/21/24 1010
